# Patient Record
Sex: Male | Born: 1945 | Race: White | Hispanic: No | Marital: Married | Smoking: Never smoker
Health system: Southern US, Community
[De-identification: ages and names within clinical notes are randomized; demographics above are authoritative.]

## PROBLEM LIST (undated history)

## (undated) DIAGNOSIS — C44319 Basal cell carcinoma of skin of other parts of face: Secondary | ICD-10-CM

## (undated) DIAGNOSIS — B351 Tinea unguium: Secondary | ICD-10-CM

## (undated) DIAGNOSIS — C9110 Chronic lymphocytic leukemia of B-cell type not having achieved remission: Secondary | ICD-10-CM

## (undated) DIAGNOSIS — C9590 Leukemia, unspecified not having achieved remission: Secondary | ICD-10-CM

## (undated) DIAGNOSIS — C44221 Squamous cell carcinoma of skin of unspecified ear and external auricular canal: Secondary | ICD-10-CM

## (undated) HISTORY — DX: Tinea unguium: B35.1

## (undated) HISTORY — DX: Chronic lymphocytic leukemia of B-cell type not having achieved remission: C91.10

## (undated) HISTORY — DX: Other disorders of bilirubin metabolism: E80.6

## (undated) HISTORY — DX: Squamous cell carcinoma of skin of unspecified ear and external auricular canal: C44.221

## (undated) HISTORY — DX: Leukemia, unspecified not having achieved remission: C95.90

## (undated) HISTORY — DX: Basal cell carcinoma of skin of other parts of face: C44.319

## (undated) SURGERY — Surgical Case
Anesthesia: *Unknown

---

## 2004-12-05 HISTORY — PX: SQUAMOUS CELL CARCINOMA EXCISION: SHX2433

## 2006-07-04 ENCOUNTER — Ambulatory Visit: Payer: Self-pay | Admitting: Oncology

## 2006-07-24 ENCOUNTER — Other Ambulatory Visit: Admission: RE | Admit: 2006-07-24 | Discharge: 2006-07-24 | Payer: Self-pay | Admitting: Oncology

## 2006-07-24 ENCOUNTER — Encounter (HOSPITAL_COMMUNITY): Payer: Self-pay | Admitting: Oncology

## 2006-07-24 LAB — CBC WITH DIFFERENTIAL/PLATELET
Eosinophils Absolute: 0.1 10*3/uL (ref 0.0–0.5)
MCH: 29.2 pg (ref 28.0–33.4)
MCV: 86.9 fL (ref 81.6–98.0)
MONO#: 0.5 10*3/uL (ref 0.1–0.9)
NEUT#: 4.3 10*3/uL (ref 1.5–6.5)
RBC: 5.02 10*6/uL (ref 4.20–5.71)
WBC: 15 10*3/uL — ABNORMAL HIGH (ref 4.0–10.0)

## 2006-07-24 LAB — COMPREHENSIVE METABOLIC PANEL
ALT: 12 U/L (ref 0–40)
BUN: 21 mg/dL (ref 6–23)
CO2: 31 mEq/L (ref 19–32)
Calcium: 10.1 mg/dL (ref 8.4–10.5)
Chloride: 103 mEq/L (ref 96–112)
Creatinine, Ser: 1.09 mg/dL (ref 0.40–1.50)
Total Bilirubin: 1.8 mg/dL — ABNORMAL HIGH (ref 0.3–1.2)

## 2006-07-24 LAB — LACTATE DEHYDROGENASE: LDH: 137 U/L (ref 94–250)

## 2006-07-24 LAB — CHCC SMEAR

## 2006-07-28 LAB — FLOW CYTOMETRY

## 2006-11-17 ENCOUNTER — Ambulatory Visit: Payer: Self-pay | Admitting: Oncology

## 2006-11-21 LAB — CBC WITH DIFFERENTIAL/PLATELET
EOS%: 1.7 % (ref 0.0–7.0)
Eosinophils Absolute: 0.3 10*3/uL (ref 0.0–0.5)
HCT: 43.1 % (ref 38.7–49.9)
HGB: 14.5 g/dL (ref 13.0–17.1)
MCH: 28.9 pg (ref 28.0–33.4)
Platelets: 344 10*3/uL (ref 145–400)
RBC: 5.03 10*6/uL (ref 4.20–5.71)
WBC: 19.9 10*3/uL — ABNORMAL HIGH (ref 4.0–10.0)

## 2006-11-21 LAB — LACTATE DEHYDROGENASE: LDH: 131 U/L (ref 94–250)

## 2006-11-21 LAB — COMPREHENSIVE METABOLIC PANEL
ALT: 17 U/L (ref 0–53)
AST: 17 U/L (ref 0–37)
BUN: 20 mg/dL (ref 6–23)
Chloride: 99 mEq/L (ref 96–112)
Creatinine, Ser: 1.01 mg/dL (ref 0.40–1.50)
Glucose, Bld: 94 mg/dL (ref 70–99)
Potassium: 4.7 mEq/L (ref 3.5–5.3)
Sodium: 138 mEq/L (ref 135–145)
Total Bilirubin: 1 mg/dL (ref 0.3–1.2)
Total Protein: 6.9 g/dL (ref 6.0–8.3)

## 2006-11-24 ENCOUNTER — Ambulatory Visit (HOSPITAL_COMMUNITY): Admission: RE | Admit: 2006-11-24 | Discharge: 2006-11-24 | Payer: Self-pay | Admitting: Oncology

## 2007-01-19 ENCOUNTER — Ambulatory Visit: Payer: Self-pay | Admitting: Oncology

## 2007-01-24 LAB — CBC WITH DIFFERENTIAL/PLATELET
Basophils Absolute: 0.4 10*3/uL — ABNORMAL HIGH (ref 0.0–0.1)
Eosinophils Absolute: 0.2 10*3/uL (ref 0.0–0.5)
HCT: 44.6 % (ref 38.7–49.9)
HGB: 15.1 g/dL (ref 13.0–17.1)
MCH: 28.8 pg (ref 28.0–33.4)
MONO#: 0.5 10*3/uL (ref 0.1–0.9)
NEUT#: 4.8 10*3/uL (ref 1.5–6.5)
NEUT%: 23.8 % — ABNORMAL LOW (ref 40.0–75.0)
RDW: 11.4 % (ref 11.2–14.6)
lymph#: 14.3 10*3/uL — ABNORMAL HIGH (ref 0.9–3.3)

## 2007-01-24 LAB — COMPREHENSIVE METABOLIC PANEL
Alkaline Phosphatase: 59 U/L (ref 39–117)
Glucose, Bld: 77 mg/dL (ref 70–99)
Sodium: 142 mEq/L (ref 135–145)
Total Bilirubin: 1.2 mg/dL (ref 0.3–1.2)
Total Protein: 6.9 g/dL (ref 6.0–8.3)

## 2007-05-17 ENCOUNTER — Ambulatory Visit: Payer: Self-pay | Admitting: Oncology

## 2007-05-21 LAB — CBC WITH DIFFERENTIAL/PLATELET
Eosinophils Absolute: 0.4 10*3/uL (ref 0.0–0.5)
HCT: 42.8 % (ref 38.7–49.9)
LYMPH%: 69.2 % — ABNORMAL HIGH (ref 14.0–48.0)
MCHC: 34.1 g/dL (ref 32.0–35.9)
MONO#: 0.4 10*3/uL (ref 0.1–0.9)
NEUT#: 5.1 10*3/uL (ref 1.5–6.5)
NEUT%: 26.2 % — ABNORMAL LOW (ref 40.0–75.0)
Platelets: 286 10*3/uL (ref 145–400)
WBC: 19.4 10*3/uL — ABNORMAL HIGH (ref 4.0–10.0)

## 2007-05-21 LAB — COMPREHENSIVE METABOLIC PANEL
CO2: 28 mEq/L (ref 19–32)
Creatinine, Ser: 1.02 mg/dL (ref 0.40–1.50)
Glucose, Bld: 132 mg/dL — ABNORMAL HIGH (ref 70–99)
Total Bilirubin: 1.3 mg/dL — ABNORMAL HIGH (ref 0.3–1.2)

## 2007-05-21 LAB — LACTATE DEHYDROGENASE: LDH: 128 U/L (ref 94–250)

## 2007-10-03 ENCOUNTER — Ambulatory Visit: Payer: Self-pay | Admitting: Oncology

## 2007-10-05 LAB — CBC WITH DIFFERENTIAL/PLATELET
BASO%: 0.2 % (ref 0.0–2.0)
EOS%: 1 % (ref 0.0–7.0)
LYMPH%: 72.5 % — ABNORMAL HIGH (ref 14.0–48.0)
MCH: 29.8 pg (ref 28.0–33.4)
MCHC: 34.5 g/dL (ref 32.0–35.9)
MCV: 86.4 fL (ref 81.6–98.0)
MONO%: 1.8 % (ref 0.0–13.0)
Platelets: 297 10*3/uL (ref 145–400)
RBC: 4.96 10*6/uL (ref 4.20–5.71)
RDW: 12.9 % (ref 11.2–14.6)

## 2007-10-05 LAB — COMPREHENSIVE METABOLIC PANEL
AST: 15 U/L (ref 0–37)
Alkaline Phosphatase: 53 U/L (ref 39–117)
Glucose, Bld: 116 mg/dL — ABNORMAL HIGH (ref 70–99)
Sodium: 140 mEq/L (ref 135–145)
Total Bilirubin: 1.5 mg/dL — ABNORMAL HIGH (ref 0.3–1.2)
Total Protein: 6.9 g/dL (ref 6.0–8.3)

## 2008-03-31 ENCOUNTER — Ambulatory Visit: Payer: Self-pay | Admitting: Oncology

## 2008-04-02 LAB — CBC WITH DIFFERENTIAL/PLATELET
BASO%: 0.4 % (ref 0.0–2.0)
HGB: 14.4 g/dL (ref 13.0–17.1)
LYMPH%: 72 % — ABNORMAL HIGH (ref 14.0–48.0)
MCV: 85.9 fL (ref 81.6–98.0)
MONO#: 0.5 10*3/uL (ref 0.1–0.9)
NEUT#: 5.4 10*3/uL (ref 1.5–6.5)
NEUT%: 24.3 % — ABNORMAL LOW (ref 40.0–75.0)
WBC: 22 10*3/uL — ABNORMAL HIGH (ref 4.0–10.0)

## 2008-04-02 LAB — COMPREHENSIVE METABOLIC PANEL
Albumin: 4.1 g/dL (ref 3.5–5.2)
Alkaline Phosphatase: 50 U/L (ref 39–117)
BUN: 23 mg/dL (ref 6–23)
Chloride: 102 mEq/L (ref 96–112)
Creatinine, Ser: 0.9 mg/dL (ref 0.40–1.50)
Potassium: 4.5 mEq/L (ref 3.5–5.3)
Sodium: 138 mEq/L (ref 135–145)
Total Bilirubin: 2.4 mg/dL — ABNORMAL HIGH (ref 0.3–1.2)

## 2008-09-30 ENCOUNTER — Ambulatory Visit: Payer: Self-pay | Admitting: Oncology

## 2008-10-02 LAB — CBC WITH DIFFERENTIAL/PLATELET
Basophils Absolute: 0 10*3/uL (ref 0.0–0.1)
Eosinophils Absolute: 0.2 10*3/uL (ref 0.0–0.5)
HGB: 14.4 g/dL (ref 13.0–17.1)
LYMPH%: 77.6 % — ABNORMAL HIGH (ref 14.0–48.0)
MCHC: 33.7 g/dL (ref 32.0–35.9)
MCV: 87.1 fL (ref 81.6–98.0)
MONO#: 0.5 10*3/uL (ref 0.1–0.9)
NEUT%: 19.4 % — ABNORMAL LOW (ref 40.0–75.0)
lymph#: 17.5 10*3/uL — ABNORMAL HIGH (ref 0.9–3.3)

## 2008-10-02 LAB — COMPREHENSIVE METABOLIC PANEL
BUN: 21 mg/dL (ref 6–23)
CO2: 29 mEq/L (ref 19–32)
Calcium: 9.5 mg/dL (ref 8.4–10.5)
Creatinine, Ser: 1.06 mg/dL (ref 0.40–1.50)
Potassium: 4.5 mEq/L (ref 3.5–5.3)
Total Protein: 6.6 g/dL (ref 6.0–8.3)

## 2008-10-02 LAB — LACTATE DEHYDROGENASE: LDH: 106 U/L (ref 94–250)

## 2009-03-31 ENCOUNTER — Ambulatory Visit: Payer: Self-pay | Admitting: Oncology

## 2009-04-02 LAB — COMPREHENSIVE METABOLIC PANEL
ALT: 12 U/L (ref 0–53)
AST: 15 U/L (ref 0–37)
Albumin: 4.4 g/dL (ref 3.5–5.2)
Alkaline Phosphatase: 54 U/L (ref 39–117)
BUN: 22 mg/dL (ref 6–23)
CO2: 21 mEq/L (ref 19–32)
Calcium: 9.4 mg/dL (ref 8.4–10.5)
Chloride: 103 mEq/L (ref 96–112)
Creatinine, Ser: 1.04 mg/dL (ref 0.40–1.50)
Glucose, Bld: 137 mg/dL — ABNORMAL HIGH (ref 70–99)
Potassium: 4.1 mEq/L (ref 3.5–5.3)
Sodium: 139 mEq/L (ref 135–145)
Total Bilirubin: 1.8 mg/dL — ABNORMAL HIGH (ref 0.3–1.2)
Total Protein: 7 g/dL (ref 6.0–8.3)

## 2009-04-02 LAB — CBC WITH DIFFERENTIAL/PLATELET
Eosinophils Absolute: 0.2 10*3/uL (ref 0.0–0.5)
HCT: 43.5 % (ref 38.4–49.9)
LYMPH%: 79.7 % — ABNORMAL HIGH (ref 14.0–49.0)
MONO%: 1.6 % (ref 0.0–14.0)
NEUT#: 5 10*3/uL (ref 1.5–6.5)
NEUT%: 17.8 % — ABNORMAL LOW (ref 39.0–75.0)
WBC: 28.1 10*3/uL — ABNORMAL HIGH (ref 4.0–10.3)

## 2009-09-28 ENCOUNTER — Ambulatory Visit: Payer: Self-pay | Admitting: Oncology

## 2009-10-01 LAB — CBC WITH DIFFERENTIAL/PLATELET
BASO%: 0.4 % (ref 0.0–2.0)
Basophils Absolute: 0.1 10*3/uL (ref 0.0–0.1)
HGB: 13.1 g/dL (ref 13.0–17.1)
LYMPH%: 86 % — ABNORMAL HIGH (ref 14.0–49.0)
MCV: 88.2 fL (ref 79.3–98.0)
MONO#: 0.9 10*3/uL (ref 0.1–0.9)
MONO%: 6.3 % (ref 0.0–14.0)
NEUT%: 4.1 % — ABNORMAL LOW (ref 39.0–75.0)
Platelets: 329 10*3/uL (ref 140–400)
WBC: 13.8 10*3/uL — ABNORMAL HIGH (ref 4.0–10.3)
lymph#: 11.9 10*3/uL — ABNORMAL HIGH (ref 0.9–3.3)

## 2009-10-01 LAB — COMPREHENSIVE METABOLIC PANEL
ALT: 33 U/L (ref 0–53)
CO2: 28 mEq/L (ref 19–32)
Chloride: 102 mEq/L (ref 96–112)
Creatinine, Ser: 0.91 mg/dL (ref 0.40–1.50)
Potassium: 4.5 mEq/L (ref 3.5–5.3)
Sodium: 138 mEq/L (ref 135–145)
Total Bilirubin: 0.9 mg/dL (ref 0.3–1.2)
Total Protein: 6.5 g/dL (ref 6.0–8.3)

## 2009-10-01 LAB — LACTATE DEHYDROGENASE: LDH: 115 U/L (ref 94–250)

## 2009-10-08 LAB — CBC WITH DIFFERENTIAL/PLATELET
Basophils Absolute: 0 10*3/uL (ref 0.0–0.1)
EOS%: 2 % (ref 0.0–7.0)
Eosinophils Absolute: 0.3 10*3/uL (ref 0.0–0.5)
HCT: 43.8 % (ref 38.4–49.9)
HGB: 14.6 g/dL (ref 13.0–17.1)
LYMPH%: 87.3 % — ABNORMAL HIGH (ref 14.0–49.0)
MCH: 29.1 pg (ref 27.2–33.4)
MCHC: 33.3 g/dL (ref 32.0–36.0)
NEUT#: 0.7 10*3/uL — ABNORMAL LOW (ref 1.5–6.5)
Platelets: 218 10*3/uL (ref 140–400)
RBC: 5.02 10*6/uL (ref 4.20–5.82)
WBC: 12.7 10*3/uL — ABNORMAL HIGH (ref 4.0–10.3)

## 2009-10-27 ENCOUNTER — Ambulatory Visit: Payer: Self-pay | Admitting: Oncology

## 2009-11-02 LAB — CBC WITH DIFFERENTIAL/PLATELET
Basophils Absolute: 0.1 10*3/uL (ref 0.0–0.1)
EOS%: 1.7 % (ref 0.0–7.0)
Eosinophils Absolute: 0.3 10*3/uL (ref 0.0–0.5)
HGB: 13.9 g/dL (ref 13.0–17.1)
MCH: 29.7 pg (ref 27.2–33.4)
MCV: 88.7 fL (ref 79.3–98.0)
NEUT#: 4.7 10*3/uL (ref 1.5–6.5)
RDW: 12.9 % (ref 11.0–14.6)

## 2010-03-31 ENCOUNTER — Ambulatory Visit: Payer: Self-pay | Admitting: Oncology

## 2010-04-01 LAB — COMPREHENSIVE METABOLIC PANEL
ALT: 16 U/L (ref 0–53)
AST: 17 U/L (ref 0–37)
Albumin: 4.1 g/dL (ref 3.5–5.2)
Alkaline Phosphatase: 58 U/L (ref 39–117)
CO2: 27 mEq/L (ref 19–32)
Calcium: 9.4 mg/dL (ref 8.4–10.5)
Chloride: 101 mEq/L (ref 96–112)
Creatinine, Ser: 1.01 mg/dL (ref 0.40–1.50)
Total Bilirubin: 2 mg/dL — ABNORMAL HIGH (ref 0.3–1.2)
Total Protein: 6.6 g/dL (ref 6.0–8.3)

## 2010-04-01 LAB — CBC WITH DIFFERENTIAL/PLATELET
Basophils Absolute: 0.1 10*3/uL (ref 0.0–0.1)
HCT: 41.7 % (ref 38.4–49.9)
HGB: 13.8 g/dL (ref 13.0–17.1)
LYMPH%: 76.7 % — ABNORMAL HIGH (ref 14.0–49.0)
MCH: 29.6 pg (ref 27.2–33.4)
MCV: 89.2 fL (ref 79.3–98.0)
MONO%: 1.8 % (ref 0.0–14.0)
NEUT#: 5.2 10*3/uL (ref 1.5–6.5)
NEUT%: 20.7 % — ABNORMAL LOW (ref 39.0–75.0)
RBC: 4.68 10*6/uL (ref 4.20–5.82)

## 2010-09-17 ENCOUNTER — Ambulatory Visit: Payer: Self-pay | Admitting: Oncology

## 2010-09-21 LAB — CBC WITH DIFFERENTIAL/PLATELET
BASO%: 0.2 % (ref 0.0–2.0)
Basophils Absolute: 0.1 10*3/uL (ref 0.0–0.1)
EOS%: 0.5 % (ref 0.0–7.0)
Eosinophils Absolute: 0.2 10*3/uL (ref 0.0–0.5)
HCT: 42.6 % (ref 38.4–49.9)
LYMPH%: 81.1 % — ABNORMAL HIGH (ref 14.0–49.0)
MONO#: 0.7 10*3/uL (ref 0.1–0.9)
MONO%: 2.5 % (ref 0.0–14.0)
NEUT#: 4.6 10*3/uL (ref 1.5–6.5)
NEUT%: 15.7 % — ABNORMAL LOW (ref 39.0–75.0)
RBC: 4.84 10*6/uL (ref 4.20–5.82)

## 2010-09-21 LAB — COMPREHENSIVE METABOLIC PANEL
AST: 20 U/L (ref 0–37)
Albumin: 4.1 g/dL (ref 3.5–5.2)
Alkaline Phosphatase: 61 U/L (ref 39–117)
Calcium: 9.4 mg/dL (ref 8.4–10.5)
Chloride: 101 mEq/L (ref 96–112)

## 2010-09-21 LAB — TECHNOLOGIST REVIEW

## 2010-12-05 HISTORY — PX: OTHER SURGICAL HISTORY: SHX169

## 2011-03-24 ENCOUNTER — Other Ambulatory Visit (HOSPITAL_COMMUNITY): Payer: Self-pay | Admitting: Oncology

## 2011-03-24 ENCOUNTER — Encounter (HOSPITAL_BASED_OUTPATIENT_CLINIC_OR_DEPARTMENT_OTHER): Payer: 59 | Admitting: Oncology

## 2011-03-24 DIAGNOSIS — C9110 Chronic lymphocytic leukemia of B-cell type not having achieved remission: Secondary | ICD-10-CM

## 2011-03-24 LAB — CBC WITH DIFFERENTIAL/PLATELET
Basophils Absolute: 0.1 10*3/uL (ref 0.0–0.1)
HGB: 13.9 g/dL (ref 13.0–17.1)
LYMPH%: 79.1 % — ABNORMAL HIGH (ref 14.0–49.0)
MCHC: 32.9 g/dL (ref 32.0–36.0)
MCV: 87.5 fL (ref 79.3–98.0)
MONO%: 1.1 % (ref 0.0–14.0)
NEUT%: 18.7 % — ABNORMAL LOW (ref 39.0–75.0)
WBC: 26.6 10*3/uL — ABNORMAL HIGH (ref 4.0–10.3)

## 2011-03-24 LAB — COMPREHENSIVE METABOLIC PANEL
ALT: 13 U/L (ref 0–53)
Albumin: 4.2 g/dL (ref 3.5–5.2)
BUN: 15 mg/dL (ref 6–23)
Chloride: 103 mEq/L (ref 96–112)
Glucose, Bld: 101 mg/dL — ABNORMAL HIGH (ref 70–99)
Potassium: 4.5 mEq/L (ref 3.5–5.3)
Total Protein: 6.6 g/dL (ref 6.0–8.3)

## 2011-09-22 ENCOUNTER — Encounter (HOSPITAL_BASED_OUTPATIENT_CLINIC_OR_DEPARTMENT_OTHER): Payer: Commercial Managed Care - PPO | Admitting: Oncology

## 2011-09-22 ENCOUNTER — Other Ambulatory Visit (HOSPITAL_COMMUNITY): Payer: Self-pay | Admitting: Oncology

## 2011-09-22 DIAGNOSIS — C9110 Chronic lymphocytic leukemia of B-cell type not having achieved remission: Secondary | ICD-10-CM

## 2011-09-22 DIAGNOSIS — R21 Rash and other nonspecific skin eruption: Secondary | ICD-10-CM

## 2011-09-22 LAB — CBC WITH DIFFERENTIAL/PLATELET
BASO%: 0.2 % (ref 0.0–2.0)
Eosinophils Absolute: 0.1 10*3/uL (ref 0.0–0.5)
HCT: 43.5 % (ref 38.4–49.9)
LYMPH%: 82.2 % — ABNORMAL HIGH (ref 14.0–49.0)
MCHC: 33.1 g/dL (ref 32.0–36.0)
MCV: 88.7 fL (ref 79.3–98.0)
MONO#: 0.5 10*3/uL (ref 0.1–0.9)
NEUT%: 15.5 % — ABNORMAL LOW (ref 39.0–75.0)
Platelets: 250 10*3/uL (ref 140–400)
WBC: 28.6 10*3/uL — ABNORMAL HIGH (ref 4.0–10.3)

## 2011-09-22 LAB — COMPREHENSIVE METABOLIC PANEL
Albumin: 4.4 g/dL (ref 3.5–5.2)
CO2: 28 mEq/L (ref 19–32)
Calcium: 9.7 mg/dL (ref 8.4–10.5)
Creatinine, Ser: 1 mg/dL (ref 0.50–1.35)
Glucose, Bld: 71 mg/dL (ref 70–99)
Total Bilirubin: 2 mg/dL — ABNORMAL HIGH (ref 0.3–1.2)

## 2012-01-10 ENCOUNTER — Telehealth: Payer: Self-pay | Admitting: Oncology

## 2012-01-10 NOTE — Telephone Encounter (Signed)
Lvm advising lab/md appt on 03/22/12 @ 9.30am. I have also mailed pt an appt calendar.

## 2012-02-17 ENCOUNTER — Other Ambulatory Visit: Payer: Self-pay | Admitting: Dermatology

## 2012-03-16 ENCOUNTER — Telehealth: Payer: Self-pay | Admitting: Oncology

## 2012-03-16 NOTE — Telephone Encounter (Signed)
Moved 4/18 appt from DM to SW on 4/17 per order for pt [phone]. S/w pt today re change w/new d/t. No availability w/SW 4/18.

## 2012-03-21 ENCOUNTER — Other Ambulatory Visit (HOSPITAL_BASED_OUTPATIENT_CLINIC_OR_DEPARTMENT_OTHER): Payer: Commercial Managed Care - PPO | Admitting: Lab

## 2012-03-21 ENCOUNTER — Telehealth: Payer: Self-pay | Admitting: Oncology

## 2012-03-21 ENCOUNTER — Ambulatory Visit (HOSPITAL_BASED_OUTPATIENT_CLINIC_OR_DEPARTMENT_OTHER): Payer: Commercial Managed Care - PPO | Admitting: Physician Assistant

## 2012-03-21 ENCOUNTER — Encounter: Payer: Self-pay | Admitting: Physician Assistant

## 2012-03-21 VITALS — BP 115/64 | HR 65 | Temp 98.5°F | Ht 68.0 in | Wt 162.5 lb

## 2012-03-21 DIAGNOSIS — D72828 Other elevated white blood cell count: Secondary | ICD-10-CM

## 2012-03-21 DIAGNOSIS — C9110 Chronic lymphocytic leukemia of B-cell type not having achieved remission: Secondary | ICD-10-CM

## 2012-03-21 LAB — CBC WITH DIFFERENTIAL/PLATELET
Basophils Absolute: 0.1 10*3/uL (ref 0.0–0.1)
EOS%: 0.4 % (ref 0.0–7.0)
Eosinophils Absolute: 0.1 10*3/uL (ref 0.0–0.5)
HCT: 41.7 % (ref 38.4–49.9)
HGB: 13.5 g/dL (ref 13.0–17.1)
MONO#: 0.4 10*3/uL (ref 0.1–0.9)
NEUT#: 4.8 10*3/uL (ref 1.5–6.5)
RDW: 13.8 % (ref 11.0–14.6)
WBC: 30 10*3/uL — ABNORMAL HIGH (ref 4.0–10.3)
lymph#: 24.6 10*3/uL — ABNORMAL HIGH (ref 0.9–3.3)

## 2012-03-21 LAB — COMPREHENSIVE METABOLIC PANEL
ALT: 13 U/L (ref 0–53)
Albumin: 4.1 g/dL (ref 3.5–5.2)
CO2: 30 mEq/L (ref 19–32)
Chloride: 106 mEq/L (ref 96–112)
Potassium: 4.6 mEq/L (ref 3.5–5.3)
Sodium: 142 mEq/L (ref 135–145)
Total Bilirubin: 1.7 mg/dL — ABNORMAL HIGH (ref 0.3–1.2)
Total Protein: 6.4 g/dL (ref 6.0–8.3)

## 2012-03-21 LAB — TECHNOLOGIST REVIEW

## 2012-03-21 LAB — LACTATE DEHYDROGENASE: LDH: 119 U/L (ref 94–250)

## 2012-03-21 NOTE — Progress Notes (Signed)
[facility] OFFICE PROGRESS NOTE  No primary provider on file.  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  Since his last clinic visit in October 2012, he reports occasional mild fatigue but no difficulty completing ADLs.  He continues to work.  He has had no fevers, chills, or night sweats.  No dyspnea or cough.  He reports normal appetite and has had no issues with nausea, vomiting, constipation, or diarrhea.  No dysuria, frequency, or hematuria.  No alteration in sensation or balance or swelling of extremities.  He has have resection of a lesion on the right earlobe consistent with squamous cell carcinoma. He has have removed another lesion on the left temporal area consistent with basal call carcinoma. These procedures were performed by Dr.Goodrich at [facility]. He is currently not on any type of medications.   His last CBC with diff in October of  revealed white blood count of 28.6, hemoglobin 14.4, hematocrit 43.5, platelets of 250, ANC of 4.4 and MCV of 88.7.  LDH was 143 with albumin of 4.2. Today, his CBC shows H/H  13.5/41.7, WBC of 30.0, ANC of 4.8, Platelets 264. His chemistries and LDH are currently pending.   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  MEDICATIONS: No current outpatient prescriptions on file.    ALLERGIES:   has no allergies on file.  REVIEW OF SYSTEMS:  The rest of the 14-point review of system was negative.   Filed Vitals:   03/21/12 1038  BP: 115/64  Pulse: 65  Temp: 98.5 F (36.9 C)   Wt Readings from Last 3 Encounters:  03/21/12 162 lb 8 oz (73.71 kg)     PHYSICAL  EXAMINATION:.  General:  This is a well-developed, well-nourished white male in no acute distress.  HEENT:  Sclerae nonicteric.  There is no oral thrush or mucositis.  Skin:  right auricle well healed area as well as in the Right temple.   Lymph:  No cervical, supraclavicular, axillary, or inguinal lymphadenopathy.  He does have some occipital adenopathy noted on the left side; this is nontender and approximately 1 x 1 cm.  Cardiac:  Regular rate and rhythm without murmurs or gallops.  Peripheral pulses are 2+.  Chest:  Lungs clear to auscultation.  Abdomen:  Positive bowel sounds, soft, nontender, nondistended.  There is no organomegaly.  Extremities:  No edema, cyanosis or calf tenderness.  Neuro:  Alert and oriented x3.  Strength, sensation, and coordination all grossly intact.     LABORATORY/RADIOLOGY DATA:   Lab 03/21/12 1007  WBC 30.0*  HGB 13.5  HCT  41.7  PLT 264  MCV 89.1  MCH 28.8  MCHC 32.3  RDW 13.8  LYMPHSABS 24.6*  MONOABS 0.4  EOSABS 0.1  BASOSABS 0.1  BANDABS --    CMP   No results found for this basename: NA:5,K:5,CL:5,CO2:5,GLUCOSE:5,BUN:5,CREATININE:5,GFRCGP,:5,CALCIUM:5,MG:5,AST:5,ALT:5,ALKPHOS:5,BILITOT:5 in the last 168 hours      Component Value Date/Time   BILITOT 2.0* 09/22/2011 1050     Radiology Studies:  No results found.     ASSESSMENT AND PLAN:  1.  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  He is currently on q.6 month surveillance and has had no major changes in his energy level since the last clinic visit and no fevers, chills or night sweats.  The patient is advised again that he really needs to find a primary care physician for his routine health maintenance, and he states that he has had a primary care physician in the past but really has not had any health issues that would warrant following up  with a primary.  He states he will consider doing this at this time, however. He continues to decline Pneumovax injections. 2. The patient will be scheduled for followup with Dr. Arline Asp in 6 months' time, at which time we will reassess CBC with diff, CMET and LDH.  He is instructed to call in the interim if any questions or problems.

## 2012-03-21 NOTE — Telephone Encounter (Signed)
Gave pt appt for October 2013 lab and md

## 2012-03-22 ENCOUNTER — Ambulatory Visit: Payer: Commercial Managed Care - PPO | Admitting: Oncology

## 2012-03-22 ENCOUNTER — Other Ambulatory Visit: Payer: Commercial Managed Care - PPO

## 2012-08-21 ENCOUNTER — Telehealth: Payer: Self-pay | Admitting: Oncology

## 2012-08-22 ENCOUNTER — Telehealth: Payer: Self-pay | Admitting: Oncology

## 2012-08-22 NOTE — Telephone Encounter (Signed)
pt  called and r/s his 09/2012 appt to 10/2012   aom

## 2012-09-20 ENCOUNTER — Other Ambulatory Visit: Payer: Commercial Managed Care - PPO | Admitting: Lab

## 2012-09-20 ENCOUNTER — Ambulatory Visit: Payer: Commercial Managed Care - PPO | Admitting: Oncology

## 2012-10-05 ENCOUNTER — Telehealth: Payer: Self-pay | Admitting: Oncology

## 2012-10-05 NOTE — Telephone Encounter (Signed)
Pt called back and r/s 11/4 appt to 12/2. Dec per pt.

## 2012-10-05 NOTE — Telephone Encounter (Addendum)
R/s 11/11 appt to 11/4 poof. lmonvm for pt re new d/t for 11/4.

## 2012-10-08 ENCOUNTER — Ambulatory Visit: Payer: Commercial Managed Care - PPO | Admitting: Family

## 2012-10-08 ENCOUNTER — Other Ambulatory Visit: Payer: Commercial Managed Care - PPO | Admitting: Lab

## 2012-10-15 ENCOUNTER — Other Ambulatory Visit: Payer: Commercial Managed Care - PPO | Admitting: Lab

## 2012-10-15 ENCOUNTER — Ambulatory Visit: Payer: Commercial Managed Care - PPO | Admitting: Oncology

## 2012-11-06 ENCOUNTER — Encounter: Payer: Self-pay | Admitting: Family

## 2012-11-06 ENCOUNTER — Other Ambulatory Visit (HOSPITAL_BASED_OUTPATIENT_CLINIC_OR_DEPARTMENT_OTHER): Payer: Commercial Managed Care - PPO

## 2012-11-06 ENCOUNTER — Telehealth: Payer: Self-pay | Admitting: Oncology

## 2012-11-06 ENCOUNTER — Ambulatory Visit (HOSPITAL_BASED_OUTPATIENT_CLINIC_OR_DEPARTMENT_OTHER): Payer: Commercial Managed Care - PPO | Admitting: Family

## 2012-11-06 VITALS — BP 133/60 | HR 57 | Temp 97.1°F | Resp 20 | Ht 68.0 in | Wt 149.6 lb

## 2012-11-06 DIAGNOSIS — L989 Disorder of the skin and subcutaneous tissue, unspecified: Secondary | ICD-10-CM

## 2012-11-06 DIAGNOSIS — C9110 Chronic lymphocytic leukemia of B-cell type not having achieved remission: Secondary | ICD-10-CM

## 2012-11-06 LAB — CBC WITH DIFFERENTIAL/PLATELET
Eosinophils Absolute: 0.2 10*3/uL (ref 0.0–0.5)
MONO#: 0.7 10*3/uL (ref 0.1–0.9)
NEUT#: 4.1 10*3/uL (ref 1.5–6.5)
Platelets: 251 10*3/uL (ref 140–400)
RBC: 4.9 10*6/uL (ref 4.20–5.82)
RDW: 13.3 % (ref 11.0–14.6)
WBC: 27.3 10*3/uL — ABNORMAL HIGH (ref 4.0–10.3)
nRBC: 0 % (ref 0–0)

## 2012-11-06 LAB — COMPREHENSIVE METABOLIC PANEL (CC13)
ALT: 19 U/L (ref 0–55)
CO2: 31 mEq/L — ABNORMAL HIGH (ref 22–29)
Calcium: 9.6 mg/dL (ref 8.4–10.4)
Chloride: 105 mEq/L (ref 98–107)
Sodium: 143 mEq/L (ref 136–145)
Total Protein: 6.8 g/dL (ref 6.4–8.3)

## 2012-11-06 LAB — LACTATE DEHYDROGENASE (CC13): LDH: 134 U/L (ref 125–245)

## 2012-11-06 NOTE — Patient Instructions (Signed)
Please consider obtaining a primary care physician to discuss ongoing fatigue, check cholesterol....etc. Please speak with Dr. Irene Limbo about the skin changes on your hand.

## 2012-11-06 NOTE — Telephone Encounter (Signed)
gv and printed appt schedule for pt for June and NOV 2014

## 2012-11-06 NOTE — Progress Notes (Signed)
Patient ID: Douglas [name], male   DOB: [birth year], 66 y.o.   MRN: [id number] CSN: [id number]  Knute Neu. Irene Limbo, MD   Problem List: Douglas [name] is a 66 y.o. Caucasian male with a problem list consisting of:  1.  Chronic lymphocytic leukemia, stage 0, diagnosed 08/23/2005, flow cytometry was diagnostic and carried out in August 2007. 2.  Onychomycosis 3.  Hyperbilirubinemia 4.  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  Basal cell carcinoma temporal areas s/p excision (03/2011)  Dr. 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 Since his last clinic visit on 03/21/2012, he reports skin changes on his left hand and ongoing mild fatigue but no difficulty completing ADLs. He continues to work 5 days per week at [employer].  He has not had any hospitalizations, fevers, chills, night sweats, dyspnea or cough.  He reports normal appetite and has intentionally lost weight to run in a 5K race.  Douglas [name] has not had any issues with nausea, vomiting, constipation, diarrhea, dysuria, frequency, or hematuria.    Past Medical History: Past Medical History  Diagnosis Date  . Leukemia   . Hyperbilirubinemia   . Onychomycosis   . 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 Basal cell carcinoma of right temple region   . Leukemia, chronic lymphocytic     Surgical History: Past Surgical History  Procedure Date  . Squamous cell carcinoma excision 2006  . Basal cell carcinoma excison 2012    Current Medications: No current outpatient prescriptions on file.    Allergies: Allergies  Allergen Reactions  . Sulfa Antibiotics Rash    Family History: Family History  Problem Relation Age of Onset  . Alzheimer's disease Mother   . Hypertension Father   . Diabetes Father   . Heart attack Father   . Heart attack Brother   . Cancer Brother     Social  History: History  Substance Use Topics  . Smoking status: Never Smoker   . Smokeless tobacco: Never Used  . Alcohol Use: No    Review of Systems: 10 Point review of systems was completed and is negative except as noted above.   Physical Exam:   Blood pressure 133/60, pulse 57, temperature 97.1 F (36.2 C), temperature source Oral, resp. rate 20, height 5\' 8"  (1.727 m), weight 149 lb 9.6 oz (67.858 kg).  General appearance: Alert, cooperative, well nourished, no apparent distress Head: Normocephalic, occipital area nodule that measures 2cm x 2cm, atraumatic Eyes: Conjunctivae/corneas clear, PERRLA, EOMI Nose: Nares, septum and mucosa are normal, no drainage or sinus tenderness Neck: No adenopathy, supple, symmetrical, trachea midline, thyroid not enlarged, no tenderness Resp: Clear to auscultation bilaterally Cardio: Regular rate and rhythm, S1, S2 normal, no murmur, click, rub or gallop GI: Soft, non-tender, distended, hypoactive bowel sounds, no organomegaly Extremities: Extremities normal, atraumatic, no cyanosis or edema, finger onychomycosis on left hand and what appears to be pre-cancerous lesions on the back of the left hand Lymph nodes: Cervical, supraclavicular, and axillary nodes normal Neurologic: Grossly normal   Laboratory Data: Results for orders placed in visit on 11/06/12 (from the past 48 hour(s))  CBC WITH DIFFERENTIAL     Status: Abnormal   Collection Time   11/06/12  9:41 AM      Component Value Range Comment   WBC 27.3 (*) 4.0 - 10.3 10e3/uL    NEUT# 4.1  1.5 - 6.5 10e3/uL    HGB 14.1  13.0 - 17.1 g/dL    HCT 96.0  45.4 - 09.8 %    Platelets 251  140 - 400 10e3/uL    MCV 88.2  79.3 - 98.0 fL    MCH 28.8  27.2 - 33.4 pg    MCHC 32.6  32.0 - 36.0 g/dL    RBC 1.19  1.47 - 8.29 10e6/uL    RDW 13.3  11.0 - 14.6 %    lymph# 22.3 (*) 0.9 - 3.3 10e3/uL    MONO# 0.7  0.1 - 0.9 10e3/uL    Eosinophils Absolute 0.2  0.0 - 0.5 10e3/uL    Basophils Absolute 0.1   0.0 - 0.1 10e3/uL    NEUT% 14.9 (*) 39.0 - 75.0 %    LYMPH% 81.6 (*) 14.0 - 49.0 %    MONO% 2.6  0.0 - 14.0 %    EOS% 0.7  0.0 - 7.0 %    BASO% 0.2  0.0 - 2.0 %    nRBC 0  0 - 0 %   COMPREHENSIVE METABOLIC PANEL (CC13)     Status: Abnormal   Collection Time   11/06/12  9:41 AM      Component Value Range Comment   Sodium 143  136 - 145 mEq/L    Potassium 4.6  3.5 - 5.1 mEq/L    Chloride 105  98 - 107 mEq/L    CO2 31 (*) 22 - 29 mEq/L    Glucose 105 (*) 70 - 99 mg/dl    BUN 56.2  7.0 - 13.0 mg/dL    Creatinine 1.0  0.7 - 1.3 mg/dL    Total Bilirubin 8.65 (*) 0.20 - 1.20 mg/dL    Alkaline Phosphatase 75  40 - 150 U/L    AST 20  5 - 34 U/L    ALT 19  0 - 55 U/L    Total Protein 6.8  6.4 - 8.3 g/dL    Albumin 3.9  3.5 - 5.0 g/dL    Calcium 9.6  8.4 - 78.4 mg/dL   LACTATE DEHYDROGENASE (CC13)     Status: Normal   Collection Time   11/06/12  9:41 AM      Component Value Range Comment   LDH 134  125 - 245 U/L 10/11/12 - NOTE new reference range.  TECHNOLOGIST REVIEW     Status: Normal   Collection Time   11/06/12  9:41 AM      Component Value Range Comment   Technologist Review smudge cells and Variant lymphs present        Imaging Studies: 1.  Chest x-ray 2 views on 11/24/2006 showed borderline cardiomegaly and mild changes of COPD and chronic bronchitis. No acute abnormality.   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 The patient is advised again that he really needs to find a primary care physician for his routine health maintenance and screening exams.  Douglas [name] states that he has had a primary care physician in the past but really has not had any health issues that would warrant following up with a primary.  Douglas [name] has been strongly encouraged to follow up with his Dermatologist, Dr. Irene Limbo about the skin changes  on the back of his left hand.   He continues to decline Pneumovax and influenza injections.  We will check  a CBC on 05/07/2013.  Douglas [name] is scheduled for followup with Dr. Arline Asp on 10/08/2013, at which time we will reassess CBC, CMP and LDH. The patient is asked to contact us in the interim if any questions or concerns.   Larina Bras, NP-C 11/06/2012, 10:07 AM

## 2013-01-19 ENCOUNTER — Other Ambulatory Visit: Payer: Self-pay

## 2013-03-11 ENCOUNTER — Other Ambulatory Visit: Payer: Self-pay | Admitting: Dermatology

## 2013-03-12 ENCOUNTER — Telehealth: Payer: Self-pay | Admitting: *Deleted

## 2013-03-12 NOTE — Telephone Encounter (Signed)
Pt called wanting a later d/t in the month. gv pt 10/22/13 lab @ 8:30am and ov @ 9am. Pt is aware.

## 2013-05-07 ENCOUNTER — Other Ambulatory Visit (HOSPITAL_BASED_OUTPATIENT_CLINIC_OR_DEPARTMENT_OTHER): Payer: Commercial Managed Care - PPO | Admitting: Lab

## 2013-05-07 DIAGNOSIS — C9110 Chronic lymphocytic leukemia of B-cell type not having achieved remission: Secondary | ICD-10-CM

## 2013-05-07 LAB — CBC WITH DIFFERENTIAL/PLATELET
BASO%: 0.2 % (ref 0.0–2.0)
EOS%: 0.7 % (ref 0.0–7.0)
HCT: 39.9 % (ref 38.4–49.9)
MCH: 29 pg (ref 27.2–33.4)
MCHC: 32.8 g/dL (ref 32.0–36.0)
NEUT%: 15.7 % — ABNORMAL LOW (ref 39.0–75.0)
lymph#: 23.2 10*3/uL — ABNORMAL HIGH (ref 0.9–3.3)

## 2013-07-10 ENCOUNTER — Other Ambulatory Visit: Payer: Self-pay

## 2013-10-08 ENCOUNTER — Other Ambulatory Visit: Payer: Commercial Managed Care - PPO | Admitting: Lab

## 2013-10-08 ENCOUNTER — Ambulatory Visit: Payer: Commercial Managed Care - PPO | Admitting: Oncology

## 2013-10-10 ENCOUNTER — Other Ambulatory Visit: Payer: Self-pay

## 2013-10-22 ENCOUNTER — Telehealth: Payer: Self-pay | Admitting: Internal Medicine

## 2013-10-22 ENCOUNTER — Ambulatory Visit (HOSPITAL_BASED_OUTPATIENT_CLINIC_OR_DEPARTMENT_OTHER): Payer: Commercial Managed Care - PPO | Admitting: Internal Medicine

## 2013-10-22 ENCOUNTER — Other Ambulatory Visit (HOSPITAL_BASED_OUTPATIENT_CLINIC_OR_DEPARTMENT_OTHER): Payer: Commercial Managed Care - PPO

## 2013-10-22 ENCOUNTER — Other Ambulatory Visit: Payer: Self-pay | Admitting: Internal Medicine

## 2013-10-22 VITALS — BP 126/70 | HR 74 | Temp 97.0°F | Resp 20 | Ht 68.0 in | Wt 157.6 lb

## 2013-10-22 DIAGNOSIS — C9110 Chronic lymphocytic leukemia of B-cell type not having achieved remission: Secondary | ICD-10-CM

## 2013-10-22 DIAGNOSIS — R05 Cough: Secondary | ICD-10-CM

## 2013-10-22 LAB — TECHNOLOGIST REVIEW

## 2013-10-22 LAB — COMPREHENSIVE METABOLIC PANEL (CC13)
ALT: 11 U/L (ref 0–55)
AST: 16 U/L (ref 5–34)
Alkaline Phosphatase: 79 U/L (ref 40–150)
Sodium: 140 mEq/L (ref 136–145)
Total Bilirubin: 1.12 mg/dL (ref 0.20–1.20)
Total Protein: 7.1 g/dL (ref 6.4–8.3)

## 2013-10-22 LAB — CBC WITH DIFFERENTIAL/PLATELET
BASO%: 0.3 % (ref 0.0–2.0)
EOS%: 1.1 % (ref 0.0–7.0)
LYMPH%: 82.5 % — ABNORMAL HIGH (ref 14.0–49.0)
MCHC: 32.1 g/dL (ref 32.0–36.0)
MCV: 88.2 fL (ref 79.3–98.0)
MONO#: 0.5 10*3/uL (ref 0.1–0.9)
MONO%: 2 % (ref 0.0–14.0)
Platelets: 309 10*3/uL (ref 140–400)
RBC: 4.82 10*6/uL (ref 4.20–5.82)
WBC: 24.6 10*3/uL — ABNORMAL HIGH (ref 4.0–10.3)

## 2013-10-22 NOTE — Patient Instructions (Signed)
Chronic Lymphocytic Leukemia Chronic lymphocytic leukemia (CLL) is a type of cancer of the bone marrow and blood cells. Bone marrow is the soft, spongy tissue inside your bone. In CLL, the bone marrow makes too many white blood cells that usually fight infection in the body (lymphocytes). CLL usually gets worse slowly and is the most common type of adult leukemia.  RISK FACTORS No one knows the exact cause of CLL. There is a higher risk of CLL in people who:   Are older than 50 years.  Are white.  Are male.  Have a family history of CLL or other cancers of the lymph system.  Are of Russian Jewish or Eastern European Jewish descent.  Have been exposed to certain chemicals, such as Agent Orange (used in the Vietnam War) or other herbicides or insecticides. SYMPTOMS  At first, there may be no symptoms of chronic lymphocytic leukemia. After a while, some symptoms may occur, such as:   Feeling more tired than usual, even after rest.  Unplanned weight loss.  Heavy sweating at night.  Fevers.  Shortness of breath.  Decreased energy.  Paleness.  Painless, swollen lymph nodes.  A feeling of fullness in the upper left part of the abdomen.  Easy bruising or bleeding.  More frequent infections. DIAGNOSIS  Your health care provider may perform the following exams and tests to diagnose CLL:  Physical exam to check for an enlarged spleen, liver, or lymph nodes.  Blood and bone marrow tests to identify the presence of cancer cells. These may include tests such as complete blood count, flow cytometry, immunophenotyping, and fluorescence in situ hybridization (FISH).  CT scan to look for swelling or abnormalities in your spleen, liver, and lymph nodes. TREATMENT  Treatment options for CLL depend on the stage and the presence of symptoms. There are a number of types of treatment used for this condition, including:  Observation.  Targeted drugs. These are drugs that interfere with  chemicals that leukemia cells need in order to grow and multiply. They identify and attack specific cancer cells without harming normal cells.  Chemotherapy drugs. These medicines kill cells that are multiplying quickly, such as leukemia cells.  Radiation.  Surgery to remove the spleen.  Biological therapy. This treatment boosts the ability of your own immune system to fight the leukemia cells.  Bone marrow or peripheral blood stem cell transplant. This treatment allows the patient to receive very high doses of chemotherapy and/or radiation. These high doses kill the cancer cells but also destroy the bone marrow. After treatment is complete, you are given donor bone marrow or stem cells, which will replace the bone marrow. HOME CARE INSTRUCTIONS   Because you have an increased risk of infection, practice good hand washing and avoid being around people who are ill or being in crowded places.  Because you have an increased risk of bleeding and bruising, avoid contact sports or other rough activities.  Only take over-the-counter or prescription medicines for pain, discomfort, or fever as directed by your health care provider.  Although some of your treatments might affect your appetite, try to eat regular, healthy meals.  If you develop any side effects, such as nausea, diarrhea, rash, white patches in your mouth, a sore throat, difficulty swallowing, or severe fatigue, tell your health care provider. He or she may have recommendations of things you can do to improve symptoms.  Consider learning some ways to cope with the stress of having a chronic illness, such as yoga, meditation,   or participating in a support group. SEEK MEDICAL CARE IF:  You develop chest pains.  You notice pain, swelling or redness anywhere in your legs.  You have pain in your belly (abdomen).  You develop new bruises that are getting bigger.  You have painful or more swollen lymph nodes.  You develop bleeding  from your gums, nose, or in your urine or stools.  You are unable to stop throwing up (vomiting).  You cannot keep liquids down.  You feel lightheaded.  You have a fever or persistent symptoms for more than 2 3 days.  You develop a severe stiff neck or headache. SEEK IMMEDIATE MEDICAL CARE IF:  You have trouble breathing or feel short of breath.  You faint. Document Released: 04/09/2009 Document Revised: 07/24/2013 Document Reviewed: 05/16/2013 Exit[employer] Patient Information 2014 [employer].  

## 2013-10-22 NOTE — Telephone Encounter (Signed)
gv and printed appt sched for May and NOV 2015

## 2013-10-22 NOTE — Progress Notes (Signed)
[facility] OFFICE PROGRESS NOTE  No PCP Per Patient [address] [place] [address]  DIAGNOSIS: CLL (chronic lymphocytic leukemia) - Plan: CBC with Differential, Comprehensive metabolic panel, CBC with Differential  Chief Complaint  Patient presents with  . CLL (chronic lymphocytic leukemia)    CURRENT THERAPY: Observation.   INTERVAL HISTORY: Douglas [name] 67 y.o. male with a history of CLL is here for follow-up.  He was last seen by NP Adela Lank A. Hunter on 11/06/2012.  His CLL is stage 0, diagnosed 08/23/2005, flow cytometry was diagnostic and carried out in August 2007.   Since his last visit, he reports doing well overall.  He had a recent trip to [place] and has since had a dry cough.  He denies any sick contacts or additional associated symptoms.   He denies any recent emergency room visits or hospitalizations, fevers, chills, night sweats, dyspnea.   He reports a good appetite with a three pound weight gain.  He declines the flu shot annually.    MEDICAL HISTORY: Past Medical History  Diagnosis Date  . Leukemia   . Hyperbilirubinemia   . Onychomycosis   . 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 Basal cell carcinoma of right temple region   . Leukemia, chronic lymphocytic     INTERIM HISTORY: has CLL (chronic lymphocytic leukemia) on his problem list.    ALLERGIES:  is allergic to sulfa antibiotics.  MEDICATIONS: currently has no medications in their medication list.  SURGICAL HISTORY:  Past Surgical History  Procedure Laterality Date  . Squamous cell carcinoma excision  2006  . Basal cell carcinoma excison  2012    REVIEW OF SYSTEMS:   Constitutional: Denies fevers, chills or abnormal weight loss Eyes: Denies blurriness of vision Ears, nose, mouth, throat, and face: Denies mucositis or sore throat Respiratory: Denies cough, dyspnea or wheezes Cardiovascular: Denies palpitation, chest discomfort or lower extremity  swelling Gastrointestinal:  Denies nausea, heartburn or change in bowel habits Skin: Denies abnormal skin rashes Lymphatics: Denies new lymphadenopathy or easy bruising Neurological:Denies numbness, tingling or new weaknesses Behavioral/Psych: Mood is stable, no new changes  All other systems were reviewed with the patient and are negative.  PHYSICAL EXAMINATION: ECOG PERFORMANCE STATUS: 0 - Asymptomatic  Blood pressure 126/70, pulse 74, temperature 97 F (36.1 C), temperature source Oral, resp. rate 20, height 5\' 8"  (1.727 m), weight 157 lb 9.6 oz (71.487 kg).  GENERAL:alert, no distress and comfortable; well-developed, well nourished.  SKIN: skin color, texture, turgor are normal, no rashes or significant lesions; L posterior neck cyst about the size of a nickel in diameter, raised and non-tender.  The patient reports this is chronic.  EYES: normal, Conjunctiva are pink and non-injected, sclera clear OROPHARYNX:no exudate, no erythema and lips, buccal mucosa, and tongue normal  NECK: supple, thyroid normal size, non-tender, without nodularity LYMPH:  no palpable lymphadenopathy in the cervical, axillary or supraclavicular LUNGS: clear to auscultation and percussion with normal breathing effort HEART: regular rate & rhythm and no murmurs and no lower extremity edema ABDOMEN:abdomen soft, non-tender and normal bowel sounds Musculoskeletal:no cyanosis of digits and no clubbing ; + onychomycosis on multiple digits of his hands.  NEURO: alert & oriented x 3 with fluent speech, no focal motor/sensory deficits   LABORATORY DATA: Results for orders placed in visit on 10/22/13 (from the past 48 hour(s))  CBC WITH DIFFERENTIAL     Status: Abnormal   Collection Time    10/22/13  8:36 AM      Result Value Range   WBC 24.6 (*) 4.0 - 10.3 10e3/uL   NEUT# 3.5  1.5 - 6.5 10e3/uL   HGB 13.7  13.0 - 17.1 g/dL   HCT 45.4  09.8 - 11.9 %   Platelets 309  140 - 400 10e3/uL   MCV 88.2  79.3 - 98.0  fL   MCH 28.3  27.2 - 33.4 pg   MCHC 32.1  32.0 - 36.0 g/dL   RBC 1.47  8.29 - 5.62 10e6/uL   RDW 13.2  11.0 - 14.6 %   lymph# 20.3 (*) 0.9 - 3.3 10e3/uL   MONO# 0.5  0.1 - 0.9 10e3/uL   Eosinophils Absolute 0.3  0.0 - 0.5 10e3/uL   Basophils Absolute 0.1  0.0 - 0.1 10e3/uL   NEUT% 14.1 (*) 39.0 - 75.0 %   LYMPH% 82.5 (*) 14.0 - 49.0 %   MONO% 2.0  0.0 - 14.0 %   EOS% 1.1  0.0 - 7.0 %   BASO% 0.3  0.0 - 2.0 %  TECHNOLOGIST REVIEW     Status: None   Collection Time    10/22/13  8:36 AM      Result Value Range   Technologist Review Variant lymphs present,smudge cells         Labs:  Lab Results  Component Value Date   WBC 24.6* 10/22/2013   HGB 13.7 10/22/2013   HCT 42.5 10/22/2013   MCV 88.2 10/22/2013   PLT 309 10/22/2013   NEUTROABS 3.5 10/22/2013      Chemistry      Component Value Date/Time   NA 143 11/06/2012 0941   NA 142 03/21/2012 1007   K 4.6 11/06/2012 0941   K 4.6 03/21/2012 1007   CL 105 11/06/2012 0941   CL 106 03/21/2012 1007   CO2 31* 11/06/2012 0941   CO2 30 03/21/2012 1007   BUN 18.0 11/06/2012 0941   BUN 17 03/21/2012 1007   CREATININE 1.0 11/06/2012 0941   CREATININE 1.00 03/21/2012 1007      Component Value Date/Time   CALCIUM 9.6 11/06/2012 0941   CALCIUM 9.3 03/21/2012 1007   ALKPHOS 75 11/06/2012 0941   ALKPHOS 55 03/21/2012 1007   AST 20 11/06/2012 0941   AST 17 03/21/2012 1007   ALT 19 11/06/2012 0941   ALT 13 03/21/2012 1007   BILITOT 1.90* 11/06/2012 0941   BILITOT 1.7* 03/21/2012 1007      CBC:  Recent Labs Lab 10/22/13 0836  WBC 24.6*  NEUTROABS 3.5  HGB 13.7  HCT 42.5  MCV 88.2  PLT 309   Microbiology Recent Results (from the past 240 hour(s))  TECHNOLOGIST REVIEW     Status: None   Collection Time    10/22/13  8:36 AM      Result Value Range Status   Technologist Review Variant lymphs present,smudge cells   Final   Studies:  No results found.   RADIOGRAPHIC STUDIES: No results found.  ASSESSMENT: Douglas [name] 67  y.o. male with a history of CLL (chronic lymphocytic leukemia) - Plan: CBC with Differential, Comprehensive metabolic panel, CBC with Differential   PLAN:  1. CLL -- Patient continues to do clinically.  He denies any constitutional symptoms.  His CBC demonstrates a stable WBC count, slightly less than 6 months ago.   2. Cough. --We counseled the patient to report to healthcare provider should it persists for greater than one week, become productive or he develop  fevers.     3. Follow-up. --patient will follow up in one year with cbc and chemistries.  CBC in 6 months.   He was provided a handout on CLL.  He was again advised to find a primary care physician for his routine health maintenance and screening exams.  He continues to decline pneumovax and influenza injections.   All questions were answered. The patient knows to call the clinic with any problems, questions or concerns. We can certainly see the patient much sooner if necessary.  I spent 10 minutes counseling the patient face to face. The total time spent in the appointment was 15 minutes.    Mireille Lacombe, MD 10/22/2013 9:35 AM

## 2014-04-21 ENCOUNTER — Telehealth: Payer: Self-pay | Admitting: Internal Medicine

## 2014-04-21 ENCOUNTER — Other Ambulatory Visit (HOSPITAL_BASED_OUTPATIENT_CLINIC_OR_DEPARTMENT_OTHER): Payer: Commercial Managed Care - PPO

## 2014-04-21 ENCOUNTER — Ambulatory Visit (HOSPITAL_BASED_OUTPATIENT_CLINIC_OR_DEPARTMENT_OTHER): Payer: Commercial Managed Care - PPO | Admitting: Internal Medicine

## 2014-04-21 VITALS — BP 118/62 | HR 70 | Temp 98.2°F | Resp 20 | Ht 68.0 in | Wt 162.5 lb

## 2014-04-21 DIAGNOSIS — C9110 Chronic lymphocytic leukemia of B-cell type not having achieved remission: Secondary | ICD-10-CM

## 2014-04-21 DIAGNOSIS — Z87898 Personal history of other specified conditions: Secondary | ICD-10-CM

## 2014-04-21 LAB — CBC WITH DIFFERENTIAL/PLATELET
BASO%: 0.2 % (ref 0.0–2.0)
Basophils Absolute: 0.1 10*3/uL (ref 0.0–0.1)
EOS ABS: 0.2 10*3/uL (ref 0.0–0.5)
EOS%: 0.5 % (ref 0.0–7.0)
HCT: 42.7 % (ref 38.4–49.9)
HGB: 14.1 g/dL (ref 13.0–17.1)
LYMPH%: 79.8 % — AB (ref 14.0–49.0)
MCH: 28.8 pg (ref 27.2–33.4)
MCHC: 33 g/dL (ref 32.0–36.0)
MCV: 87.1 fL (ref 79.3–98.0)
MONO#: 0.6 10*3/uL (ref 0.1–0.9)
MONO%: 2.1 % (ref 0.0–14.0)
NEUT%: 17.4 % — ABNORMAL LOW (ref 39.0–75.0)
NEUTROS ABS: 5.1 10*3/uL (ref 1.5–6.5)
NRBC: 0 % (ref 0–0)
Platelets: 255 10*3/uL (ref 140–400)
RBC: 4.9 10*6/uL (ref 4.20–5.82)
RDW: 13.6 % (ref 11.0–14.6)
WBC: 29.3 10*3/uL — AB (ref 4.0–10.3)
lymph#: 23.4 10*3/uL — ABNORMAL HIGH (ref 0.9–3.3)

## 2014-04-21 LAB — TECHNOLOGIST REVIEW

## 2014-04-21 LAB — COMPREHENSIVE METABOLIC PANEL (CC13)
ALBUMIN: 3.7 g/dL (ref 3.5–5.0)
ALT: 14 U/L (ref 0–55)
ANION GAP: 12 meq/L — AB (ref 3–11)
AST: 16 U/L (ref 5–34)
Alkaline Phosphatase: 63 U/L (ref 40–150)
BUN: 18.3 mg/dL (ref 7.0–26.0)
CALCIUM: 9.1 mg/dL (ref 8.4–10.4)
CHLORIDE: 106 meq/L (ref 98–109)
CO2: 24 meq/L (ref 22–29)
Creatinine: 1 mg/dL (ref 0.7–1.3)
GLUCOSE: 103 mg/dL (ref 70–140)
POTASSIUM: 4.1 meq/L (ref 3.5–5.1)
SODIUM: 142 meq/L (ref 136–145)
TOTAL PROTEIN: 6.5 g/dL (ref 6.4–8.3)
Total Bilirubin: 1.66 mg/dL — ABNORMAL HIGH (ref 0.20–1.20)

## 2014-04-21 NOTE — Patient Instructions (Signed)
Chronic Lymphocytic Leukemia Chronic lymphocytic leukemia (CLL) is a type of cancer of the bone marrow and blood cells. Bone marrow is the soft, spongy tissue inside your bone. In CLL, the bone marrow makes too many white blood cells that usually fight infection in the body (lymphocytes). CLL usually gets worse slowly and is the most common type of adult leukemia.  RISK FACTORS No one knows the exact cause of CLL. There is a higher risk of CLL in people who:   Are older than 50 years.  Are white.  Are male.  Have a family history of CLL or other cancers of the lymph system.  Are of Russian Jewish or Eastern European Jewish descent.  Have been exposed to certain chemicals, such as Agent Orange (used in the Vietnam War) or other herbicides or insecticides. SYMPTOMS  At first, there may be no symptoms of chronic lymphocytic leukemia. After a while, some symptoms may occur, such as:   Feeling more tired than usual, even after rest.  Unplanned weight loss.  Heavy sweating at night.  Fevers.  Shortness of breath.  Decreased energy.  Paleness.  Painless, swollen lymph nodes.  A feeling of fullness in the upper left part of the abdomen.  Easy bruising or bleeding.  More frequent infections. DIAGNOSIS  Your health care provider may perform the following exams and tests to diagnose CLL:  Physical exam to check for an enlarged spleen, liver, or lymph nodes.  Blood and bone marrow tests to identify the presence of cancer cells. These may include tests such as complete blood count, flow cytometry, immunophenotyping, and fluorescence in situ hybridization (FISH).  CT scan to look for swelling or abnormalities in your spleen, liver, and lymph nodes. TREATMENT  Treatment options for CLL depend on the stage and the presence of symptoms. There are a number of types of treatment used for this condition, including:  Observation.  Targeted drugs. These are drugs that interfere with  chemicals that leukemia cells need in order to grow and multiply. They identify and attack specific cancer cells without harming normal cells.  Chemotherapy drugs. These medicines kill cells that are multiplying quickly, such as leukemia cells.  Radiation.  Surgery to remove the spleen.  Biological therapy. This treatment boosts the ability of your own immune system to fight the leukemia cells.  Bone marrow or peripheral blood stem cell transplant. This treatment allows the patient to receive very high doses of chemotherapy and/or radiation. These high doses kill the cancer cells but also destroy the bone marrow. After treatment is complete, you are given donor bone marrow or stem cells, which will replace the bone marrow. HOME CARE INSTRUCTIONS   Because you have an increased risk of infection, practice good hand washing and avoid being around people who are ill or being in crowded places.  Because you have an increased risk of bleeding and bruising, avoid contact sports or other rough activities.  Only take over-the-counter or prescription medicines for pain, discomfort, or fever as directed by your health care provider.  Although some of your treatments might affect your appetite, try to eat regular, healthy meals.  If you develop any side effects, such as nausea, diarrhea, rash, white patches in your mouth, a sore throat, difficulty swallowing, or severe fatigue, tell your health care provider. He or she may have recommendations of things you can do to improve symptoms.  Consider learning some ways to cope with the stress of having a chronic illness, such as yoga, meditation,   or participating in a support group. SEEK MEDICAL CARE IF:  You develop chest pains.  You notice pain, swelling or redness anywhere in your legs.  You have pain in your belly (abdomen).  You develop new bruises that are getting bigger.  You have painful or more swollen lymph nodes.  You develop bleeding  from your gums, nose, or in your urine or stools.  You are unable to stop throwing up (vomiting).  You cannot keep liquids down.  You feel lightheaded.  You have a fever or persistent symptoms for more than 2 3 days.  You develop a severe stiff neck or headache. SEEK IMMEDIATE MEDICAL CARE IF:  You have trouble breathing or feel short of breath.  You faint. Document Released: 04/09/2009 Document Revised: 07/24/2013 Document Reviewed: 05/16/2013 Exit[employer] Patient Information 2014 [employer].  

## 2014-04-21 NOTE — Telephone Encounter (Signed)
gv adn printed appt sched and avs for opt fro NOV adn May 2016

## 2014-04-21 NOTE — Progress Notes (Signed)
[place] OFFICE PROGRESS NOTE  No PCP Per Patient No address on file  DIAGNOSIS: CLL (chronic lymphocytic leukemia)  Chief Complaint  Patient presents with  . Follow-up    CURRENT THERAPY: Observation.   INTERVAL HISTORY: Douglas [name] 68 y.o. male with a history of CLL is here for follow-up.  He was last seen by me on 10/22/2013.  His CLL is stage 0, diagnosed 08/23/2005, flow cytometry was diagnostic and carried out in August 2007.   Since his last visit, he reports doing well overall.  He had a recent trip to [place] and has since had a dry cough.  He denies any sick contacts or additional associated symptoms.   He denies any recent emergency room visits or hospitalizations, fevers, chills, night sweats, dyspnea.   He reports a good appetite with a three pound weight gain.  He declines the flu shot annually.  He declines colonoscopy. He was never a smoker.    MEDICAL HISTORY: Past Medical History  Diagnosis Date  . Leukemia   . Hyperbilirubinemia   . Onychomycosis   . 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 Basal cell carcinoma of right temple region   . Leukemia, chronic lymphocytic     INTERIM HISTORY: has CLL (chronic lymphocytic leukemia) on his problem list.    ALLERGIES:  is allergic to sulfa antibiotics.  MEDICATIONS: currently has no medications in their medication list.  SURGICAL HISTORY:  Past Surgical History  Procedure Laterality Date  . Squamous cell carcinoma excision  2006  . Basal cell carcinoma excison  2012    REVIEW OF SYSTEMS:   Constitutional: Denies fevers, chills or abnormal weight loss Eyes: Denies blurriness of vision Ears, nose, mouth, throat, and face: Denies mucositis or sore throat Respiratory: Denies cough, dyspnea or wheezes Cardiovascular: Denies palpitation, chest discomfort or lower extremity swelling Gastrointestinal:  Denies nausea, heartburn or change in bowel habits Skin: Denies abnormal skin  rashes Lymphatics: Denies new lymphadenopathy or easy bruising Neurological:Denies numbness, tingling or new weaknesses Behavioral/Psych: Mood is stable, no new changes  All other systems were reviewed with the patient and are negative.  PHYSICAL EXAMINATION: ECOG PERFORMANCE STATUS: 0 - Asymptomatic  Blood pressure 118/62, pulse 70, temperature 98.2 F (36.8 C), temperature source Oral, resp. rate 20, height 5\' 8"  (1.727 m), weight 162 lb 8 oz (73.71 kg).  GENERAL:alert, no distress and comfortable; well-developed, well nourished.  SKIN: skin color, texture, turgor are normal, no rashes or significant lesions; L posterior neck cyst about the size of a nickel in diameter, raised and non-tender.  The patient reports this is chronic.  EYES: normal, Conjunctiva are pink and non-injected, sclera clear OROPHARYNX:no exudate, no erythema and lips, buccal mucosa, and tongue normal  NECK: supple, thyroid normal size, non-tender, without nodularity LYMPH:  no palpable lymphadenopathy in the cervical, axillary or supraclavicular LUNGS: clear to auscultation and percussion with normal breathing effort HEART: regular rate & rhythm and no murmurs and no lower extremity edema ABDOMEN:abdomen soft, non-tender and normal bowel sounds Musculoskeletal:no cyanosis of digits and no clubbing ; + onychomycosis on multiple digits of his hands.  NEURO: alert & oriented x 3 with fluent speech, no focal motor/sensory deficits   LABORATORY DATA: Results for orders placed in visit on 04/21/14 (from the past 48 hour(s))  COMPREHENSIVE METABOLIC PANEL (BJ47)     Status: Abnormal   Collection Time    04/21/14  8:10 AM      Result Value Ref  Range   Sodium 142  136 - 145 mEq/L   Potassium 4.1  3.5 - 5.1 mEq/L   Chloride 106  98 - 109 mEq/L   CO2 24  22 - 29 mEq/L   Glucose 103  70 - 140 mg/dl   BUN 18.3  7.0 - 26.0 mg/dL   Creatinine 1.0  0.7 - 1.3 mg/dL   Total Bilirubin 1.66 (*) 0.20 - 1.20 mg/dL   Alkaline  Phosphatase 63  40 - 150 U/L   AST 16  5 - 34 U/L   ALT 14  0 - 55 U/L   Total Protein 6.5  6.4 - 8.3 g/dL   Albumin 3.7  3.5 - 5.0 g/dL   Calcium 9.1  8.4 - 10.4 mg/dL   Anion Gap 12 (*) 3 - 11 mEq/L  CBC WITH DIFFERENTIAL     Status: Abnormal   Collection Time    04/21/14  8:11 AM      Result Value Ref Range   WBC 29.3 (*) 4.0 - 10.3 10e3/uL   NEUT# 5.1  1.5 - 6.5 10e3/uL   HGB 14.1  13.0 - 17.1 g/dL   HCT 42.7  38.4 - 49.9 %   Platelets 255  140 - 400 10e3/uL   MCV 87.1  79.3 - 98.0 fL   MCH 28.8  27.2 - 33.4 pg   MCHC 33.0  32.0 - 36.0 g/dL   RBC 4.90  4.20 - 5.82 10e6/uL   RDW 13.6  11.0 - 14.6 %   lymph# 23.4 (*) 0.9 - 3.3 10e3/uL   MONO# 0.6  0.1 - 0.9 10e3/uL   Eosinophils Absolute 0.2  0.0 - 0.5 10e3/uL   Basophils Absolute 0.1  0.0 - 0.1 10e3/uL   NEUT% 17.4 (*) 39.0 - 75.0 %   LYMPH% 79.8 (*) 14.0 - 49.0 %   MONO% 2.1  0.0 - 14.0 %   EOS% 0.5  0.0 - 7.0 %   BASO% 0.2  0.0 - 2.0 %   nRBC 0  0 - 0 %    Labs:  Lab Results  Component Value Date   WBC 29.3* 04/21/2014   HGB 14.1 04/21/2014   HCT 42.7 04/21/2014   MCV 87.1 04/21/2014   PLT 255 04/21/2014   NEUTROABS 5.1 04/21/2014      Chemistry      Component Value Date/Time   NA 142 04/21/2014 0810   NA 142 03/21/2012 1007   K 4.1 04/21/2014 0810   K 4.6 03/21/2012 1007   CL 105 11/06/2012 0941   CL 106 03/21/2012 1007   CO2 24 04/21/2014 0810   CO2 30 03/21/2012 1007   BUN 18.3 04/21/2014 0810   BUN 17 03/21/2012 1007   CREATININE 1.0 04/21/2014 0810   CREATININE 1.00 03/21/2012 1007      Component Value Date/Time   CALCIUM 9.1 04/21/2014 0810   CALCIUM 9.3 03/21/2012 1007   ALKPHOS 63 04/21/2014 0810   ALKPHOS 55 03/21/2012 1007   AST 16 04/21/2014 0810   AST 17 03/21/2012 1007   ALT 14 04/21/2014 0810   ALT 13 03/21/2012 1007   BILITOT 1.66* 04/21/2014 0810   BILITOT 1.7* 03/21/2012 1007      CBC:  Recent Labs Lab 04/21/14 0811  WBC 29.3*  NEUTROABS 5.1  HGB 14.1  HCT 42.7  MCV 87.1  PLT 255    Microbiology No results found for this or any previous visit (from the past 240 hour(s)). Studies:  No results  found.   RADIOGRAPHIC STUDIES: No results found.  ASSESSMENT: Douglas [name] 68 y.o. male with a history of CLL (chronic lymphocytic leukemia)   PLAN:  1. CLL -- Patient continues to do clinically.  He denies any constitutional symptoms.  His CBC demonstrates a stable WBC count, slightly less than 6 months ago.   2. Follow-up. --patient will follow up in one year with cbc and chemistries.  CBC in 6 months.   He was provided a handout on CLL.  He was again advised to find a primary care physician for his routine health maintenance and screening exams.  He continues to decline pneumovax and influenza injections.   All questions were answered. The patient knows to call the clinic with any problems, questions or concerns. We can certainly see the patient much sooner if necessary.  I spent 10 minutes counseling the patient face to face. The total time spent in the appointment was 15 minutes.    Concha Norway, MD 04/21/2014 8:53 AM

## 2014-10-17 ENCOUNTER — Other Ambulatory Visit: Payer: Self-pay

## 2014-10-17 DIAGNOSIS — C9110 Chronic lymphocytic leukemia of B-cell type not having achieved remission: Secondary | ICD-10-CM

## 2014-10-20 ENCOUNTER — Other Ambulatory Visit (HOSPITAL_BASED_OUTPATIENT_CLINIC_OR_DEPARTMENT_OTHER): Payer: Commercial Managed Care - PPO

## 2014-10-20 DIAGNOSIS — C9110 Chronic lymphocytic leukemia of B-cell type not having achieved remission: Secondary | ICD-10-CM

## 2014-10-20 DIAGNOSIS — Z856 Personal history of leukemia: Secondary | ICD-10-CM

## 2014-10-20 LAB — CBC WITH DIFFERENTIAL/PLATELET
BASO%: 0.3 % (ref 0.0–2.0)
BASOS ABS: 0.1 10*3/uL (ref 0.0–0.1)
EOS%: 0.6 % (ref 0.0–7.0)
Eosinophils Absolute: 0.2 10*3/uL (ref 0.0–0.5)
HEMATOCRIT: 47.7 % (ref 38.4–49.9)
HGB: 15 g/dL (ref 13.0–17.1)
LYMPH#: 23.9 10*3/uL — AB (ref 0.9–3.3)
LYMPH%: 80 % — ABNORMAL HIGH (ref 14.0–49.0)
MCH: 27.8 pg (ref 27.2–33.4)
MCHC: 31.4 g/dL — ABNORMAL LOW (ref 32.0–36.0)
MCV: 88.6 fL (ref 79.3–98.0)
MONO#: 0.5 10*3/uL (ref 0.1–0.9)
MONO%: 1.8 % (ref 0.0–14.0)
NEUT%: 17.3 % — AB (ref 39.0–75.0)
NEUTROS ABS: 5.2 10*3/uL (ref 1.5–6.5)
Platelets: 285 10*3/uL (ref 140–400)
RBC: 5.39 10*6/uL (ref 4.20–5.82)
RDW: 13.8 % (ref 11.0–14.6)
WBC: 29.8 10*3/uL — ABNORMAL HIGH (ref 4.0–10.3)

## 2014-10-20 LAB — TECHNOLOGIST REVIEW

## 2015-03-18 ENCOUNTER — Telehealth: Payer: Self-pay | Admitting: Hematology and Oncology

## 2015-03-18 NOTE — Telephone Encounter (Signed)
Called and left a message with a new appoinment with dr Alvy Bimler

## 2015-03-19 ENCOUNTER — Other Ambulatory Visit: Payer: Self-pay | Admitting: Dermatology

## 2015-04-20 ENCOUNTER — Ambulatory Visit: Payer: Commercial Managed Care - PPO

## 2015-04-20 ENCOUNTER — Other Ambulatory Visit: Payer: Commercial Managed Care - PPO

## 2015-05-12 ENCOUNTER — Telehealth: Payer: Self-pay | Admitting: Hematology and Oncology

## 2015-05-12 ENCOUNTER — Other Ambulatory Visit (HOSPITAL_BASED_OUTPATIENT_CLINIC_OR_DEPARTMENT_OTHER): Payer: Commercial Managed Care - PPO

## 2015-05-12 ENCOUNTER — Ambulatory Visit (HOSPITAL_BASED_OUTPATIENT_CLINIC_OR_DEPARTMENT_OTHER): Payer: Commercial Managed Care - PPO | Admitting: Hematology and Oncology

## 2015-05-12 ENCOUNTER — Other Ambulatory Visit: Payer: Self-pay | Admitting: Hematology and Oncology

## 2015-05-12 ENCOUNTER — Encounter: Payer: Self-pay | Admitting: Hematology and Oncology

## 2015-05-12 VITALS — BP 121/71 | HR 66 | Temp 97.5°F | Resp 19 | Ht 68.0 in | Wt 163.4 lb

## 2015-05-12 DIAGNOSIS — C9110 Chronic lymphocytic leukemia of B-cell type not having achieved remission: Secondary | ICD-10-CM

## 2015-05-12 DIAGNOSIS — Z85828 Personal history of other malignant neoplasm of skin: Secondary | ICD-10-CM | POA: Diagnosis not present

## 2015-05-12 DIAGNOSIS — R17 Unspecified jaundice: Secondary | ICD-10-CM

## 2015-05-12 LAB — CBC WITH DIFFERENTIAL/PLATELET
BASO%: 0.2 % (ref 0.0–2.0)
BASOS ABS: 0.1 10*3/uL (ref 0.0–0.1)
EOS%: 0.7 % (ref 0.0–7.0)
Eosinophils Absolute: 0.2 10*3/uL (ref 0.0–0.5)
HCT: 43.6 % (ref 38.4–49.9)
HEMOGLOBIN: 14.4 g/dL (ref 13.0–17.1)
LYMPH%: 78.3 % — AB (ref 14.0–49.0)
MCH: 29.1 pg (ref 27.2–33.4)
MCHC: 33 g/dL (ref 32.0–36.0)
MCV: 88.1 fL (ref 79.3–98.0)
MONO#: 0.6 10*3/uL (ref 0.1–0.9)
MONO%: 2 % (ref 0.0–14.0)
NEUT%: 18.8 % — ABNORMAL LOW (ref 39.0–75.0)
NEUTROS ABS: 5.3 10*3/uL (ref 1.5–6.5)
PLATELETS: 246 10*3/uL (ref 140–400)
RBC: 4.95 10*6/uL (ref 4.20–5.82)
RDW: 13.5 % (ref 11.0–14.6)
WBC: 28.2 10*3/uL — ABNORMAL HIGH (ref 4.0–10.3)
lymph#: 22.1 10*3/uL — ABNORMAL HIGH (ref 0.9–3.3)

## 2015-05-12 LAB — COMPREHENSIVE METABOLIC PANEL (CC13)
ALT: 12 U/L (ref 0–55)
ANION GAP: 6 meq/L (ref 3–11)
AST: 18 U/L (ref 5–34)
Albumin: 3.7 g/dL (ref 3.5–5.0)
Alkaline Phosphatase: 65 U/L (ref 40–150)
BUN: 17.1 mg/dL (ref 7.0–26.0)
CO2: 30 mEq/L — ABNORMAL HIGH (ref 22–29)
Calcium: 9.5 mg/dL (ref 8.4–10.4)
Chloride: 105 mEq/L (ref 98–109)
Creatinine: 1 mg/dL (ref 0.7–1.3)
EGFR: 79 mL/min/{1.73_m2} — AB (ref >90)
GLUCOSE: 113 mg/dL (ref 70–140)
Potassium: 5.1 mEq/L (ref 3.5–5.1)
SODIUM: 141 meq/L (ref 136–145)
Total Bilirubin: 2.12 mg/dL — ABNORMAL HIGH (ref 0.20–1.20)
Total Protein: 6.5 g/dL (ref 6.4–8.3)

## 2015-05-12 LAB — LACTATE DEHYDROGENASE (CC13): LDH: 171 U/L (ref 125–245)

## 2015-05-12 LAB — TECHNOLOGIST REVIEW

## 2015-05-12 NOTE — Telephone Encounter (Signed)
Gave adn printed appt sched and avs for pt for June 2017 °

## 2015-05-13 DIAGNOSIS — R17 Unspecified jaundice: Secondary | ICD-10-CM | POA: Insufficient documentation

## 2015-05-13 DIAGNOSIS — Z85828 Personal history of other malignant neoplasm of skin: Secondary | ICD-10-CM | POA: Insufficient documentation

## 2015-05-13 NOTE — Assessment & Plan Note (Signed)
He has no signs and symptoms of disease progression I reinforced the importance of preventive vaccination and age appropriate screening programs I will see him on an annual basis with history, physical examination and blood work

## 2015-05-13 NOTE — Assessment & Plan Note (Signed)
He is at risk of skin cancer due to CLL I recommend skin protection and regular dermatology follow-up   

## 2015-05-13 NOTE — Progress Notes (Signed)
[facility] progress notes  Patient Care Team: No Pcp Per Patient as PCP - General (General Practice)  CHIEF COMPLAINTS/PURPOSE OF VISIT:  CLL, history of skin ca  HISTORY OF PRESENTING ILLNESS:  Douglas [name] 69 y.o. male was transferred to my care after his prior physician has left.  I reviewed the patient's records extensive and collaborated the history with the patient. 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 He has no symptoms and was observed. He denies new lymphadenopathy. He follows closely with dermatology. He never have surveillance colonoscopy or prostate exams nor vaccinations as he does not "believe" in them  MEDICAL HISTORY:  Past Medical History  Diagnosis Date  . Leukemia   . Hyperbilirubinemia   . Onychomycosis   . 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 Basal cell carcinoma of right temple region   . Leukemia, chronic lymphocytic     SURGICAL HISTORY: Past Surgical History  Procedure Laterality Date  . Squamous cell carcinoma excision  2006  . Basal cell carcinoma excison  2012    SOCIAL HISTORY: History   Social History  . Marital Status: Married    Spouse Name: N/A  . Number of Children: N/A  . Years of Education: N/A   Occupational History  . Not on file.   Social History Main Topics  . Smoking status: Never Smoker   . Smokeless tobacco: Never Used  . Alcohol Use: No  . Drug Use: No  . Sexual Activity: Not on file   Other Topics Concern  . Not on file   Social History Narrative    FAMILY HISTORY: Family History  Problem Relation Age of Onset  . Alzheimer's disease Mother   . Hypertension Father   . Diabetes Father   . Heart attack Father   . Heart attack Brother   . Cancer Brother     CLL    ALLERGIES:  is allergic to sulfa antibiotics.  MEDICATIONS:  No current outpatient prescriptions on file.   No current facility-administered  medications for this visit.    REVIEW OF SYSTEMS:   Constitutional: Denies fevers, chills or abnormal night sweats Eyes: Denies blurriness of vision, double vision or watery eyes Ears, nose, mouth, throat, and face: Denies mucositis or sore throat Respiratory: Denies cough, dyspnea or wheezes Cardiovascular: Denies palpitation, chest discomfort or lower extremity swelling Gastrointestinal:  Denies nausea, heartburn or change in bowel habits Skin: Denies abnormal skin rashes Lymphatics: Denies new lymphadenopathy or easy bruising Neurological:Denies numbness, tingling or new weaknesses Behavioral/Psych: Mood is stable, no new changes  All other systems were reviewed with the patient and are negative.  PHYSICAL EXAMINATION: ECOG PERFORMANCE STATUS: 0 - Asymptomatic  Filed Vitals:   05/12/15 0925  BP: 121/71  Pulse: 66  Temp: 97.5 F (36.4 C)  Resp: 19   Filed Weights   05/12/15 0925  Weight: 163 lb 6.4 oz (74.118 kg)    GENERAL:alert, no distress and comfortable SKIN: skin color, texture, turgor are normal, no rashes or significant lesions EYES: normal, conjunctiva are pink and non-injected, sclera clear OROPHARYNX:no exudate, normal lips, buccal mucosa, and tongue  NECK: supple, thyroid normal size, non-tender, without nodularity LYMPH:  no palpable lymphadenopathy in the cervical, axillary or inguinal LUNGS: clear to auscultation and percussion with normal breathing effort HEART: regular rate & rhythm and no murmurs without lower extremity edema ABDOMEN:abdomen soft, non-tender and normal bowel sounds  Musculoskeletal:no cyanosis of digits and no clubbing  PSYCH: alert & oriented x 3 with fluent speech NEURO: no focal motor/sensory deficits  LABORATORY DATA:  I have reviewed the data as listed Lab Results  Component Value Date   WBC 28.2* 05/12/2015   HGB 14.4 05/12/2015   HCT 43.6 05/12/2015   MCV 88.1 05/12/2015   PLT 246 05/12/2015    Recent Labs   05/12/15 0917  NA 141  K 5.1  CO2 30*  GLUCOSE 113  BUN 17.1  CREATININE 1.0  CALCIUM 9.5  PROT 6.5  ALBUMIN 3.7  AST 18  ALT 12  ALKPHOS 65  BILITOT 2.12*    ASSESSMENT & PLAN:  CLL (chronic lymphocytic leukemia) He has no signs and symptoms of disease progression I reinforced the importance of preventive vaccination and age appropriate screening programs I will see him on an annual basis with history, physical examination and blood work  History of skin cancer He is at risk of skin cancer due to CLL I recommend skin protection and regular dermatology follow-up  High bilirubin He is not symptomatic and has no signs of anemia. 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 Observe only   Orders Placed This Encounter  Procedures  . CBC with Differential/Platelet    Standing Status: Future     Number of Occurrences:      Standing Expiration Date: 06/15/2016    All questions were answered. The patient knows to call the clinic with any problems, questions or concerns. I spent 25 minutes counseling the patient face to face. The total time spent in the appointment was 30 minutes and more than 50% was on counseling.     [facility], [place], MD 05/13/2015 9:56 PM

## 2015-05-13 NOTE — Assessment & Plan Note (Signed)
He is not symptomatic and has no signs of anemia. 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 Observe only

## 2015-06-01 ENCOUNTER — Other Ambulatory Visit: Payer: Self-pay

## 2016-03-02 ENCOUNTER — Telehealth: Payer: Self-pay | Admitting: Hematology and Oncology

## 2016-03-02 NOTE — Telephone Encounter (Signed)
Due to 6/6 lab meeting adjusted lab/NG from 8 am to 8:30 am. Spoke with patient he is aware.

## 2016-05-10 ENCOUNTER — Telehealth: Payer: Self-pay | Admitting: Hematology and Oncology

## 2016-05-10 ENCOUNTER — Other Ambulatory Visit (HOSPITAL_BASED_OUTPATIENT_CLINIC_OR_DEPARTMENT_OTHER): Payer: Commercial Managed Care - PPO

## 2016-05-10 ENCOUNTER — Encounter: Payer: Self-pay | Admitting: Hematology and Oncology

## 2016-05-10 ENCOUNTER — Ambulatory Visit (HOSPITAL_BASED_OUTPATIENT_CLINIC_OR_DEPARTMENT_OTHER): Payer: Commercial Managed Care - PPO | Admitting: Hematology and Oncology

## 2016-05-10 VITALS — BP 111/59 | HR 55 | Temp 97.8°F | Resp 18 | Ht 68.0 in | Wt 147.8 lb

## 2016-05-10 DIAGNOSIS — C9110 Chronic lymphocytic leukemia of B-cell type not having achieved remission: Secondary | ICD-10-CM

## 2016-05-10 DIAGNOSIS — Z85828 Personal history of other malignant neoplasm of skin: Secondary | ICD-10-CM

## 2016-05-10 LAB — CBC WITH DIFFERENTIAL/PLATELET
BASO%: 0.2 % (ref 0.0–2.0)
Basophils Absolute: 0 10*3/uL (ref 0.0–0.1)
EOS%: 0.8 % (ref 0.0–7.0)
Eosinophils Absolute: 0.2 10*3/uL (ref 0.0–0.5)
HEMATOCRIT: 41.7 % (ref 38.4–49.9)
HEMOGLOBIN: 13.6 g/dL (ref 13.0–17.1)
LYMPH#: 17.7 10*3/uL — AB (ref 0.9–3.3)
LYMPH%: 78.4 % — ABNORMAL HIGH (ref 14.0–49.0)
MCH: 29.1 pg (ref 27.2–33.4)
MCHC: 32.6 g/dL (ref 32.0–36.0)
MCV: 89.1 fL (ref 79.3–98.0)
MONO#: 0.6 10*3/uL (ref 0.1–0.9)
MONO%: 2.5 % (ref 0.0–14.0)
NEUT%: 18.1 % — ABNORMAL LOW (ref 39.0–75.0)
NEUTROS ABS: 4.1 10*3/uL (ref 1.5–6.5)
Platelets: 242 10*3/uL (ref 140–400)
RBC: 4.68 10*6/uL (ref 4.20–5.82)
RDW: 13.5 % (ref 11.0–14.6)
WBC: 22.6 10*3/uL — AB (ref 4.0–10.3)
nRBC: 0 % (ref 0–0)

## 2016-05-10 LAB — TECHNOLOGIST REVIEW

## 2016-05-10 NOTE — Assessment & Plan Note (Signed)
He is at risk of skin cancer due to CLL I recommend skin protection and regular dermatology follow-up   

## 2016-05-10 NOTE — Telephone Encounter (Signed)
Gave and pritned appt sched and avs fo rpt for June 2018

## 2016-05-10 NOTE — Progress Notes (Signed)
[place] OFFICE PROGRESS NOTE  Patient Care Team: No Pcp Per Patient as PCP - General (General Practice)  SUMMARY OF ONCOLOGIC HISTORY:  Douglas [name] was transferred to my care after his prior physician has left.  I reviewed the patient's records extensive and collaborated the history with the patient. 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 He has no symptoms and was observed. He denies new lymphadenopathy. He follows closely with dermatology. He never have surveillance colonoscopy or prostate exams nor vaccinations as he does not "believe" in them He does not want routine vaccination either The patient decided have a primary care doctor.  INTERVAL HISTORY: Please see below for problem oriented charting. He appears well. No new lymphadenopathy. Denies night sweats or skin changes. REVIEW OF SYSTEMS:   Constitutional: Denies fevers, chills or abnormal weight loss Eyes: Denies blurriness of vision Ears, nose, mouth, throat, and face: Denies mucositis or sore throat Respiratory: Denies cough, dyspnea or wheezes Cardiovascular: Denies palpitation, chest discomfort or lower extremity swelling Gastrointestinal:  Denies nausea, heartburn or change in bowel habits Skin: Denies abnormal skin rashes Lymphatics: Denies new lymphadenopathy or easy bruising Neurological:Denies numbness, tingling or new weaknesses Behavioral/Psych: Mood is stable, no new changes  All other systems were reviewed with the patient and are negative.  I have reviewed the past medical history, past surgical history, social history and family history with the patient and they are unchanged from previous note.  ALLERGIES:  is allergic to sulfa antibiotics.  MEDICATIONS:  No current outpatient prescriptions on file.   No current facility-administered medications for this visit.    PHYSICAL EXAMINATION: ECOG PERFORMANCE STATUS: 0 -  Asymptomatic  Filed Vitals:   05/10/16 0854  BP: 111/59  Pulse: 55  Temp: 97.8 F (36.6 C)  Resp: 18   Filed Weights   05/10/16 0854  Weight: 147 lb 12.8 oz (67.042 kg)    GENERAL:alert, no distress and comfortable SKIN: skin color, texture, turgor are normal, no rashes or significant lesions. No suspicious skin lesions EYES: normal, Conjunctiva are pink and non-injected, sclera clear OROPHARYNX:no exudate, no erythema and lips, buccal mucosa, and tongue normal  NECK: supple, thyroid normal size, non-tender, without nodularity LYMPH:  no palpable lymphadenopathy in the cervical, axillary or inguinal LUNGS: clear to auscultation and percussion with normal breathing effort HEART: regular rate & rhythm and no murmurs and no lower extremity edema ABDOMEN:abdomen soft, non-tender and normal bowel sounds Musculoskeletal:no cyanosis of digits and no clubbing  NEURO: alert & oriented x 3 with fluent speech, no focal motor/sensory deficits  LABORATORY DATA:  I have reviewed the data as listed    Component Value Date/Time   NA 141 05/12/2015 0917   NA 142 03/21/2012 1007   K 5.1 05/12/2015 0917   K 4.6 03/21/2012 1007   CL 105 11/06/2012 0941   CL 106 03/21/2012 1007   CO2 30* 05/12/2015 0917   CO2 30 03/21/2012 1007   GLUCOSE 113 05/12/2015 0917   GLUCOSE 105* 11/06/2012 0941   GLUCOSE 110* 03/21/2012 1007   BUN 17.1 05/12/2015 0917   BUN 17 03/21/2012 1007   CREATININE 1.0 05/12/2015 0917   CREATININE 1.00 03/21/2012 1007   CALCIUM 9.5 05/12/2015 0917   CALCIUM 9.3 03/21/2012 1007   PROT 6.5 05/12/2015 0917   PROT 6.4 03/21/2012 1007   ALBUMIN 3.7 05/12/2015 0917   ALBUMIN 4.1 03/21/2012 1007   AST 18 05/12/2015 0917  AST 17 03/21/2012 1007   ALT 12 05/12/2015 0917   ALT 13 03/21/2012 1007   ALKPHOS 65 05/12/2015 0917   ALKPHOS 55 03/21/2012 1007   BILITOT 2.12* 05/12/2015 0917   BILITOT 1.7* 03/21/2012 1007    No results found for: SPEP, UPEP  Lab Results   Component Value Date   WBC 22.6* 05/10/2016   NEUTROABS 4.1 05/10/2016   HGB 13.6 05/10/2016   HCT 41.7 05/10/2016   MCV 89.1 05/10/2016   PLT 242 05/10/2016      Chemistry      Component Value Date/Time   NA 141 05/12/2015 0917   NA 142 03/21/2012 1007   K 5.1 05/12/2015 0917   K 4.6 03/21/2012 1007   CL 105 11/06/2012 0941   CL 106 03/21/2012 1007   CO2 30* 05/12/2015 0917   CO2 30 03/21/2012 1007   BUN 17.1 05/12/2015 0917   BUN 17 03/21/2012 1007   CREATININE 1.0 05/12/2015 0917   CREATININE 1.00 03/21/2012 1007      Component Value Date/Time   CALCIUM 9.5 05/12/2015 0917   CALCIUM 9.3 03/21/2012 1007   ALKPHOS 65 05/12/2015 0917   ALKPHOS 55 03/21/2012 1007   AST 18 05/12/2015 0917   AST 17 03/21/2012 1007   ALT 12 05/12/2015 0917   ALT 13 03/21/2012 1007   BILITOT 2.12* 05/12/2015 0917   BILITOT 1.7* 03/21/2012 1007      ASSESSMENT & PLAN:  CLL (chronic lymphocytic leukemia) He has no signs and symptoms of disease progression I reinforced the importance of preventive vaccination and age appropriate screening programs However, the patient declined. I will see him on an annual basis with history, physical examination and blood work  History of skin cancer He is at risk of skin cancer due to CLL I recommend skin protection and regular dermatology follow-up     Orders Placed This Encounter  Procedures  . CBC with Differential/Platelet    Standing Status: Future     Number of Occurrences:      Standing Expiration Date: 06/14/2017   All questions were answered. The patient knows to call the clinic with any problems, questions or concerns. No barriers to learning was detected. I spent 15 minutes counseling the patient face to face. The total time spent in the appointment was 20 minutes and more than 50% was on counseling and review of test results     [facility], [place], MD 05/10/2016 9:37 AM

## 2016-05-10 NOTE — Assessment & Plan Note (Signed)
He has no signs and symptoms of disease progression I reinforced the importance of preventive vaccination and age appropriate screening programs However, the patient declined. I will see him on an annual basis with history, physical examination and blood work  

## 2016-05-30 ENCOUNTER — Telehealth: Payer: Self-pay | Admitting: *Deleted

## 2016-05-30 ENCOUNTER — Ambulatory Visit (INDEPENDENT_AMBULATORY_CARE_PROVIDER_SITE_OTHER): Payer: Commercial Managed Care - PPO | Admitting: Family Medicine

## 2016-05-30 VITALS — BP 128/68 | HR 58 | Temp 98.1°F | Resp 16 | Ht 66.5 in | Wt 145.2 lb

## 2016-05-30 DIAGNOSIS — C9110 Chronic lymphocytic leukemia of B-cell type not having achieved remission: Secondary | ICD-10-CM | POA: Diagnosis not present

## 2016-05-30 DIAGNOSIS — Z1329 Encounter for screening for other suspected endocrine disorder: Secondary | ICD-10-CM | POA: Diagnosis not present

## 2016-05-30 DIAGNOSIS — Z0000 Encounter for general adult medical examination without abnormal findings: Secondary | ICD-10-CM | POA: Diagnosis not present

## 2016-05-30 DIAGNOSIS — Z1212 Encounter for screening for malignant neoplasm of rectum: Secondary | ICD-10-CM

## 2016-05-30 DIAGNOSIS — Z1383 Encounter for screening for respiratory disorder NEC: Secondary | ICD-10-CM

## 2016-05-30 DIAGNOSIS — Z136 Encounter for screening for cardiovascular disorders: Secondary | ICD-10-CM | POA: Diagnosis not present

## 2016-05-30 DIAGNOSIS — Z1389 Encounter for screening for other disorder: Secondary | ICD-10-CM

## 2016-05-30 DIAGNOSIS — Z1211 Encounter for screening for malignant neoplasm of colon: Secondary | ICD-10-CM | POA: Diagnosis not present

## 2016-05-30 DIAGNOSIS — Z113 Encounter for screening for infections with a predominantly sexual mode of transmission: Secondary | ICD-10-CM

## 2016-05-30 DIAGNOSIS — Z23 Encounter for immunization: Secondary | ICD-10-CM | POA: Diagnosis not present

## 2016-05-30 DIAGNOSIS — R17 Unspecified jaundice: Secondary | ICD-10-CM

## 2016-05-30 LAB — POCT URINALYSIS DIP (MANUAL ENTRY)
BILIRUBIN UA: NEGATIVE
GLUCOSE UA: NEGATIVE
Leukocytes, UA: NEGATIVE
Nitrite, UA: NEGATIVE
Protein Ur, POC: NEGATIVE
RBC UA: NEGATIVE
SPEC GRAV UA: 1.025
Urobilinogen, UA: 0.2
pH, UA: 5.5

## 2016-05-30 NOTE — Patient Instructions (Addendum)
IF you received an x-ray today, you will receive an invoice from [facility]. Please contact [facility] at [phone] with questions or concerns regarding your invoice.   IF you received labwork today, you will receive an invoice from [employer]. Please contact [facility] at [phone] with questions or concerns regarding your invoice.   Our billing staff will not be able to assist you with questions regarding bills from these companies.  You will be contacted with the lab results as soon as they are available. The fastest way to get your results is to activate your My Chart account. Instructions are located on the last page of this paperwork. If you have not heard from [place] regarding the results in 2 weeks, please contact this office.     Keeping you healthy  Get these tests  Blood pressure- Have your blood pressure checked once a year by your healthcare provider.  Normal blood pressure is 120/80  Weight- Have your body mass index (BMI) calculated to screen for obesity.  BMI is a measure of body fat based on height and weight. You can also calculate your own BMI at [employer][email or web address].  Cholesterol- Have your cholesterol checked every year.  Diabetes- Have your blood sugar checked regularly if you have high blood pressure, high cholesterol, have a family history of diabetes or if you are overweight.  Screening for Colon Cancer- Colonoscopy starting at age 108.  Screening may begin sooner depending on your family history and other health conditions. Follow up colonoscopy as directed by your Gastroenterologist.  Screening for Prostate Cancer- Both blood work (PSA) and a rectal exam help screen for Prostate Cancer.  Screening begins at age 41 with African-American men and at age 17 with Caucasian men.  Screening may begin sooner depending on your family history.  Take these medicines  Aspirin- One aspirin daily can help prevent  Heart disease and Stroke.  Flu shot- Every fall.  Tetanus- Every 10 years.  Zostavax- Once after the age of 39 to prevent Shingles.  Pneumonia shot- Once after the age of 86; if you are younger than 57, ask your healthcare provider if you need a Pneumonia shot.  Take these steps  Don't smoke- If you do smoke, talk to your doctor about quitting.  For tips on how to quit, go to www.smokefree.gov or call 1-800-QUIT-NOW.  Be physically active- Exercise 5 days a week for at least 30 minutes.  If you are not already physically active start slow and gradually work up to 30 minutes of moderate physical activity.  Examples of moderate activity include walking briskly, mowing the yard, dancing, swimming, bicycling, etc.  Eat a healthy diet- Eat a variety of healthy food such as fruits, vegetables, low fat milk, low fat cheese, yogurt, lean meant, poultry, fish, beans, tofu, etc. For more information go to www.thenutritionsource.org  Drink alcohol in moderation- Limit alcohol intake to less than two drinks a day. Never drink and drive.  Dentist- Brush and floss twice daily; visit your dentist twice a year.  Depression- Your emotional health is as important as your physical health. If you're feeling down, or losing interest in things you would normally enjoy please talk to your healthcare provider.  Eye exam- Visit your eye doctor every year.  Safe sex- If you may be exposed to a sexually transmitted infection, use a condom.  Seat belts- Seat belts can save your life; always wear one.  Smoke/Carbon Monoxide detectors- These detectors need to be installed  on the appropriate level of your home.  Replace batteries at least once a year.  Skin cancer- When out in the sun, cover up and use sunscreen 15 SPF or higher.  Violence- If anyone is threatening you, please tell your healthcare provider.  Living Will/ Health care power of attorney- Speak with your healthcare provider and family.     Why  follow it? Research shows. . Those who follow the Mediterranean diet have a reduced risk of heart disease  . The diet is associated with a reduced incidence of Parkinson's and Alzheimer's diseases . People following the diet may have longer life expectancies and lower rates of chronic diseases  . The Dietary Guidelines for Americans recommends the Mediterranean diet as an eating plan to promote health and prevent disease  What Is the Mediterranean Diet?  . Healthy eating plan based on typical foods and recipes of Mediterranean-style cooking . The diet is primarily a plant based diet; these foods should make up a majority of meals   Starches - Plant based foods should make up a majority of meals - They are an important sources of vitamins, minerals, energy, antioxidants, and fiber - Choose whole grains, foods high in fiber and minimally processed items  - Typical grain sources include wheat, oats, barley, corn, brown rice, bulgar, farro, millet, polenta, couscous  - Various types of beans include chickpeas, lentils, fava beans, black beans, white beans   Fruits  Veggies - Large quantities of antioxidant rich fruits & veggies; 6 or more servings  - Vegetables can be eaten raw or lightly drizzled with oil and cooked  - Vegetables common to the traditional Mediterranean Diet include: artichokes, arugula, beets, broccoli, brussel sprouts, cabbage, carrots, celery, collard greens, cucumbers, eggplant, kale, leeks, lemons, lettuce, mushrooms, okra, onions, peas, peppers, potatoes, pumpkin, radishes, rutabaga, shallots, spinach, sweet potatoes, turnips, zucchini - Fruits common to the Mediterranean Diet include: apples, apricots, avocados, cherries, clementines, dates, figs, grapefruits, grapes, melons, nectarines, oranges, peaches, pears, pomegranates, strawberries, tangerines  Fats - Replace butter and margarine with healthy oils, such as olive oil, canola oil, and tahini  - Limit nuts to no more than  a handful a day  - Nuts include walnuts, almonds, pecans, pistachios, pine nuts  - Limit or avoid candied, honey roasted or heavily salted nuts - Olives are central to the [employer] - can be eaten whole or used in a variety of dishes   Meats Protein - Limiting red meat: no more than a few times a month - When eating red meat: choose lean cuts and keep the portion to the size of deck of cards - Eggs: approx. 0 to 4 times a week  - Fish and lean poultry: at least 2 a week  - Healthy protein sources include, chicken, [place], lean beef, lamb - Increase intake of seafood such as tuna, salmon, trout, mackerel, shrimp, scallops - Avoid or limit high fat processed meats such as sausage and bacon  Dairy - Include moderate amounts of low fat dairy products  - Focus on healthy dairy such as fat free yogurt, skim milk, low or reduced fat cheese - Limit dairy products higher in fat such as whole or 2% milk, cheese, ice cream  Alcohol - Moderate amounts of red wine is ok  - No more than 5 oz daily for women (all ages) and men older than age 42  - No more than 10 oz of wine daily for men younger than 76  Other - Limit  sweets and other desserts  - Use herbs and spices instead of salt to flavor foods  - Herbs and spices common to the traditional Mediterranean Diet include: basil, bay leaves, chives, cloves, cumin, fennel, garlic, lavender, marjoram, mint, oregano, parsley, pepper, rosemary, sage, savory, sumac, tarragon, thyme   It's not just a diet, it's a lifestyle:  . The Mediterranean diet includes lifestyle factors typical of those in the region  . Foods, drinks and meals are best eaten with others and savored . Daily physical activity is important for overall good health . This could be strenuous exercise like running and aerobics . This could also be more leisurely activities such as walking, housework, yard-work, or taking the stairs . Moderation is the key; a balanced and healthy diet  accommodates most foods and drinks . Consider portion sizes and frequency of consumption of certain foods   Meal Ideas & Options:  . Breakfast:  o Whole wheat toast or whole wheat English muffins with peanut butter & hard boiled egg o Steel cut oats topped with apples & cinnamon and skim milk  o Fresh fruit: banana, strawberries, melon, berries, peaches  o Smoothies: strawberries, bananas, greek yogurt, peanut butter o Low fat greek yogurt with blueberries and granola  o Egg white omelet with spinach and mushrooms o Breakfast couscous: whole wheat couscous, apricots, skim milk, cranberries  . Sandwiches:  o Hummus and grilled vegetables (peppers, zucchini, squash) on whole wheat bread   o Grilled chicken on whole wheat pita with lettuce, tomatoes, cucumbers or tzatziki  o Tuna salad on whole wheat bread: tuna salad made with greek yogurt, olives, red peppers, capers, green onions o Garlic rosemary lamb pita: lamb sauted with garlic, rosemary, salt & pepper; add lettuce, cucumber, greek yogurt to pita - flavor with lemon juice and black pepper  . Seafood:  o Mediterranean grilled salmon, seasoned with garlic, basil, parsley, lemon juice and black pepper o Shrimp, lemon, and spinach whole-grain pasta salad made with low fat greek yogurt  o Seared scallops with lemon orzo  o Seared tuna steaks seasoned salt, pepper, coriander topped with tomato mixture of olives, tomatoes, olive oil, minced garlic, parsley, green onions and cappers  . Meats:  o Herbed greek chicken salad with kalamata olives, cucumber, feta  o Red bell peppers stuffed with spinach, bulgur, lean ground beef (or lentils) & topped with feta   o Kebabs: skewers of chicken, tomatoes, onions, zucchini, squash  o [place] burgers: made with red onions, mint, dill, lemon juice, feta cheese topped with roasted red peppers . Vegetarian o Cucumber salad: cucumbers, artichoke hearts, celery, red onion, feta cheese, tossed in olive oil &  lemon juice  o Hummus and whole grain pita points with a greek salad (lettuce, tomato, feta, olives, cucumbers, red onion) o Lentil soup with celery, carrots made with vegetable broth, garlic, salt and pepper  o Tabouli salad: parsley, bulgur, mint, scallions, cucumbers, tomato, radishes, lemon juice, olive oil, salt and pepper.

## 2016-05-30 NOTE — Progress Notes (Signed)
Subjective:  By signing my name below, I, Moises Blood, attest that this documentation has been prepared under the direction and in the presence of Delman Cheadle, MD. Electronically Signed: Moises Blood, [place]. 05/30/2016 , 3:07 PM .  Patient was seen in Room 14 .   Patient ID: Douglas [name], male    DOB: Apr 10, 1946, 70 y.o.   MRN: [id number] Chief Complaint  Patient presents with  . Annual Exam    for the insurance   HPI Douglas [name] is a 71 y.o. male who presents to [facility] for annual physical.   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 He hasn't shown signs of symptoms or disease progression. He sees dermatologist annually as he has risk to skin cancer due to CLL. He was diagnosed with CLL in 2007 from lab work, but he showed no symptoms. He had an appointment with Dr. Alvy Bimler 3 weeks ago, who recommended preventative vaccinations and age appropriate screenings, but patient refused. He has not had history of surveillance colonoscopy, cancer screenings, or vaccinations, as "he does not believe in them".   He agrees to a tetanus shot today. He declined colonoscopy, other vaccinations or cancer screening today. He denies any urinary symptoms. He denies any GI symptoms. He feels fatigue but notes sleeping late and waking up early.   He informs liver problems since he was young. His bilirubin was last checked a year ago. He denies taking any supplements or medications. He eats plenty of fruit, some vegetables and meat. He eats plenty of fish. He tries to avoid fried foods.   Patient notes having a sebaceous cyst over the back of his neck. He's mentioned this to Dr. Alvy Bimler in the past and was informed it to be benign.   He works Pharmacist, hospital for 38 years.   Past Medical History  Diagnosis Date  . Leukemia ([place])   . Hyperbilirubinemia   . Onychomycosis   . 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 Basal cell carcinoma of right temple region   . Leukemia,  chronic lymphocytic ([place])    Prior to Admission medications   Not on File   Allergies  Allergen Reactions  . Sulfa Antibiotics Nausea Only and Rash   Past Surgical History  Procedure Laterality Date  . Squamous cell carcinoma excision  2006  . Basal cell carcinoma excison  2012   Family History  Problem Relation Age of Onset  . Alzheimer's disease Mother   . Hypertension Father   . Diabetes Father   . Heart attack Father   . Heart attack Brother   . Cancer Brother     CLL   Social History   Social History  . Marital Status: Married    Spouse Name: N/A  . Number of Children: N/A  . Years of Education: N/A   Social History Main Topics  . Smoking status: Never Smoker   . Smokeless tobacco: Never Used  . Alcohol Use: No  . Drug Use: No  . Sexual Activity: Not Asked     Comment: married, he is retired   Other Topics Concern  . None   Social History Narrative    Review of Systems  Constitutional: Positive for fatigue. Negative for fever and chills.  Respiratory: Negative for cough, shortness of breath and wheezing.   Gastrointestinal: Negative for nausea, vomiting, diarrhea and constipation.  Genitourinary: Negative for dysuria, urgency, frequency and hematuria.  All other systems reviewed and are negative.  Objective:   Physical Exam  Constitutional: He is oriented to person, place, and time. He appears well-developed and well-nourished. No distress.  HENT:  Head: Normocephalic and atraumatic.  Right Ear: Tympanic membrane normal.  Left Ear: Tympanic membrane normal.  Nose: Nose normal.  Mouth/Throat: Oropharynx is clear and moist.  Eyes: EOM are normal. Pupils are equal, round, and reactive to light.  Neck: Neck supple. No thyromegaly present.  2cm soft, non tender, mobile subcutaneous mass on left posterior neck  Cardiovascular: Normal rate, regular rhythm, S1 normal, S2 normal and normal heart sounds.   No murmur heard. Pulmonary/Chest: Effort  normal and breath sounds normal. No respiratory distress.  Abdominal: Soft. Bowel sounds are normal. He exhibits no distension. There is no hepatosplenomegaly. There is no tenderness.  Abdominal aorta faintly palpable, does not feel enlarged  Musculoskeletal: Normal range of motion.  Lymphadenopathy:       Head (right side): No submandibular adenopathy present.       Head (left side): No submandibular adenopathy present.       Right: No supraclavicular adenopathy present.       Left: No supraclavicular adenopathy present.  Neurological: He is alert and oriented to person, place, and time.  Skin: Skin is warm and dry.  Psychiatric: He has a normal mood and affect. His behavior is normal.  Nursing note and vitals reviewed.   BP 128/68 mmHg  Pulse 58  Temp(Src) 98.1 F (36.7 C) (Oral)  Resp 16  Ht 5' 6.5" (1.689 m)  Wt 145 lb 3.2 oz (65.862 kg)  BMI 23.09 kg/m2  SpO2 96%    Assessment & Plan:   1. Annual physical exam   2. Routine screening for STI (sexually transmitted infection)   3. Screening for cardiovascular, respiratory, and genitourinary diseases   4. Screening for colorectal cancer   5. Screening for thyroid disorder   6. 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 CLL (chronic lymphocytic leukemia) (HCC) - stable, follows with oncology regularly but has not had any problems with it.   Pt steadfastly declines most age-indicated screening test such as colorectal cancer screen, prostate screen, and AAA screen - because of the CLL diagnosis which he feels he would have been better off not knowing he just wants to let his life and nature take its course and not go looking for problems when he doesn't have any sxs.  This choice has been documented multiple times in chart prior as well.  Orders Placed This Encounter  Procedures  . Tdap vaccine greater than or equal to 7yo IM  . Comprehensive  metabolic panel    Order Specific Question:  Has the patient fasted?    Answer:  Yes  . Lipid panel    Order Specific Question:  Has the patient fasted?    Answer:  Yes  . TSH  . HIV antibody  . Hepatitis C Antibody  . Comprehensive metabolic panel  . Lipid panel  . TSH  . Care order/instruction    AVS - please print after vaccine is given, after labs and urine are collected, pt can go  . POCT urinalysis dipstick    I personally performed the services described in this documentation, which was scribed in my presence. The recorded information has been reviewed and considered, and addended by me as needed.   Delman Cheadle, M.D.  [facility] [address]  [place], [place] [address] [phone] phone [phone] fax  06/05/2016 7:56 AM

## 2016-05-30 NOTE — Telephone Encounter (Signed)
Faxed signed authorization to release medical records (lab/imaging) only, per Dr Brigitte Pulse. Confirmation page received at [phone].

## 2016-05-31 LAB — COMPREHENSIVE METABOLIC PANEL
ALBUMIN: 4.4 g/dL (ref 3.6–5.1)
ALK PHOS: 55 U/L (ref 40–115)
ALT: 10 U/L (ref 9–46)
AST: 14 U/L (ref 10–35)
BILIRUBIN TOTAL: 2.1 mg/dL — AB (ref 0.2–1.2)
BUN: 20 mg/dL (ref 7–25)
CALCIUM: 9.1 mg/dL (ref 8.6–10.3)
CO2: 29 mmol/L (ref 20–31)
CREATININE: 0.93 mg/dL (ref 0.70–1.18)
Chloride: 103 mmol/L (ref 98–110)
Glucose, Bld: 87 mg/dL (ref 65–99)
Potassium: 4.1 mmol/L (ref 3.5–5.3)
SODIUM: 140 mmol/L (ref 135–146)
TOTAL PROTEIN: 6.7 g/dL (ref 6.1–8.1)

## 2016-05-31 LAB — LIPID PANEL
Cholesterol: 196 mg/dL (ref 125–200)
HDL: 53 mg/dL (ref >=40)
LDL CALC: 134 mg/dL — AB (ref <130)
TRIGLYCERIDES: 46 mg/dL (ref <150)
Total CHOL/HDL Ratio: 3.7 Ratio (ref <=5.0)
VLDL: 9 mg/dL (ref <30)

## 2016-05-31 LAB — HEPATITIS C ANTIBODY: HCV AB: NEGATIVE

## 2016-05-31 LAB — HIV ANTIBODY (ROUTINE TESTING W REFLEX): HIV 1&2 Ab, 4th Generation: NONREACTIVE

## 2016-06-01 LAB — COMPREHENSIVE METABOLIC PANEL
ALBUMIN: 4.4 g/dL (ref 3.6–5.1)
ALT: 10 U/L (ref 9–46)
AST: 16 U/L (ref 10–35)
Alkaline Phosphatase: 58 U/L (ref 40–115)
BILIRUBIN TOTAL: 2.1 mg/dL — AB (ref 0.2–1.2)
BUN: 20 mg/dL (ref 7–25)
CALCIUM: 9.2 mg/dL (ref 8.6–10.3)
CO2: 25 mmol/L (ref 20–31)
Chloride: 102 mmol/L (ref 98–110)
Creat: 0.9 mg/dL (ref 0.70–1.18)
GLUCOSE: 87 mg/dL (ref 65–99)
POTASSIUM: 4.2 mmol/L (ref 3.5–5.3)
Sodium: 141 mmol/L (ref 135–146)
Total Protein: 6.7 g/dL (ref 6.1–8.1)

## 2016-06-01 LAB — LIPID PANEL
Cholesterol: 211 mg/dL — ABNORMAL HIGH (ref 125–200)
HDL: 55 mg/dL (ref >=40)
LDL CALC: 147 mg/dL — AB (ref <130)
TRIGLYCERIDES: 47 mg/dL (ref <150)
Total CHOL/HDL Ratio: 3.8 Ratio (ref <=5.0)
VLDL: 9 mg/dL (ref <30)

## 2016-06-01 LAB — TSH
TSH: 4.18 mIU/L (ref 0.40–4.50)
TSH: 4.3 m[IU]/L (ref 0.40–4.50)

## 2017-04-07 ENCOUNTER — Encounter: Payer: Self-pay | Admitting: Family Medicine

## 2017-04-07 ENCOUNTER — Ambulatory Visit (HOSPITAL_COMMUNITY)
Admission: EM | Admit: 2017-04-07 | Discharge: 2017-04-08 | Disposition: A | Discharge status: Home / Self Care | Payer: Commercial Managed Care - PPO | Attending: Internal Medicine | Admitting: Internal Medicine

## 2017-04-07 ENCOUNTER — Ambulatory Visit (INDEPENDENT_AMBULATORY_CARE_PROVIDER_SITE_OTHER): Payer: Commercial Managed Care - PPO | Admitting: Family Medicine

## 2017-04-07 ENCOUNTER — Encounter (HOSPITAL_COMMUNITY): Payer: Self-pay

## 2017-04-07 ENCOUNTER — Ambulatory Visit (HOSPITAL_COMMUNITY)
Admission: EM | Disposition: A | Discharge status: Home / Self Care | Payer: Self-pay | Source: Home / Self Care | Attending: Emergency Medicine

## 2017-04-07 ENCOUNTER — Emergency Department (HOSPITAL_COMMUNITY): Payer: Commercial Managed Care - PPO

## 2017-04-07 VITALS — BP 138/59 | HR 32 | Temp 98.2°F | Resp 12 | Ht 66.5 in | Wt 155.0 lb

## 2017-04-07 DIAGNOSIS — I442 Atrioventricular block, complete: Secondary | ICD-10-CM

## 2017-04-07 DIAGNOSIS — I4510 Unspecified right bundle-branch block: Secondary | ICD-10-CM | POA: Diagnosis not present

## 2017-04-07 DIAGNOSIS — I10 Essential (primary) hypertension: Secondary | ICD-10-CM | POA: Insufficient documentation

## 2017-04-07 DIAGNOSIS — E785 Hyperlipidemia, unspecified: Secondary | ICD-10-CM | POA: Diagnosis present

## 2017-04-07 DIAGNOSIS — Z882 Allergy status to sulfonamides status: Secondary | ICD-10-CM | POA: Insufficient documentation

## 2017-04-07 DIAGNOSIS — Z8249 Family history of ischemic heart disease and other diseases of the circulatory system: Secondary | ICD-10-CM

## 2017-04-07 DIAGNOSIS — C9110 Chronic lymphocytic leukemia of B-cell type not having achieved remission: Secondary | ICD-10-CM | POA: Insufficient documentation

## 2017-04-07 DIAGNOSIS — Z950 Presence of cardiac pacemaker: Secondary | ICD-10-CM | POA: Diagnosis not present

## 2017-04-07 DIAGNOSIS — R030 Elevated blood-pressure reading, without diagnosis of hypertension: Secondary | ICD-10-CM | POA: Diagnosis not present

## 2017-04-07 HISTORY — PX: PACEMAKER IMPLANT: EP1218

## 2017-04-07 LAB — CBC
HEMATOCRIT: 44.3 % (ref 39.0–52.0)
HEMOGLOBIN: 14.1 g/dL (ref 13.0–17.0)
MCH: 28 pg (ref 26.0–34.0)
MCHC: 31.8 g/dL (ref 30.0–36.0)
MCV: 88.1 fL (ref 78.0–100.0)
Platelets: 187 10*3/uL (ref 150–400)
RBC: 5.03 MIL/uL (ref 4.22–5.81)
RDW: 13.9 % (ref 11.5–15.5)
WBC: 15 10*3/uL — ABNORMAL HIGH (ref 4.0–10.5)

## 2017-04-07 LAB — COMPREHENSIVE METABOLIC PANEL
ALT: 46 U/L (ref 17–63)
AST: 32 U/L (ref 15–41)
Albumin: 4.2 g/dL (ref 3.5–5.0)
Alkaline Phosphatase: 70 U/L (ref 38–126)
Anion gap: 10 (ref 5–15)
BUN: 13 mg/dL (ref 6–20)
CHLORIDE: 104 mmol/L (ref 101–111)
CO2: 24 mmol/L (ref 22–32)
CREATININE: 1.14 mg/dL (ref 0.61–1.24)
Calcium: 9.3 mg/dL (ref 8.9–10.3)
GFR calc Af Amer: >60 mL/min (ref >60)
GFR calc non Af Amer: >60 mL/min (ref >60)
Glucose, Bld: 103 mg/dL — ABNORMAL HIGH (ref 65–99)
Potassium: 4.7 mmol/L (ref 3.5–5.1)
SODIUM: 138 mmol/L (ref 135–145)
Total Bilirubin: 3.4 mg/dL — ABNORMAL HIGH (ref 0.3–1.2)
Total Protein: 6.9 g/dL (ref 6.5–8.1)

## 2017-04-07 LAB — TROPONIN I

## 2017-04-07 LAB — PROTIME-INR
INR: 0.96
Prothrombin Time: 12.8 seconds (ref 11.4–15.2)

## 2017-04-07 LAB — MAGNESIUM: Magnesium: 2.3 mg/dL (ref 1.7–2.4)

## 2017-04-07 LAB — CBG MONITORING, ED: GLUCOSE-CAPILLARY: 91 mg/dL (ref 65–99)

## 2017-04-07 SURGERY — PACEMAKER IMPLANT

## 2017-04-07 MED ORDER — ONDANSETRON HCL 4 MG/2ML IJ SOLN: 4.0000 mg | Freq: Four times a day (QID) | INTRAMUSCULAR | Status: DC | PRN

## 2017-04-07 MED ORDER — LIDOCAINE HCL (PF) 1 % IJ SOLN
INTRAMUSCULAR | Status: DC | PRN
  Administered 2017-04-07: 40 mL via INTRADERMAL

## 2017-04-07 MED ORDER — CEFAZOLIN SODIUM-DEXTROSE 2-4 GM/100ML-% IV SOLN
INTRAVENOUS | Status: AC
  Filled 2017-04-07: qty 100

## 2017-04-07 MED ORDER — SODIUM CHLORIDE 0.9 % IR SOLN
Status: AC
  Filled 2017-04-07: qty 2

## 2017-04-07 MED ORDER — SODIUM CHLORIDE 0.9 % IR SOLN
80.0000 mg | Status: AC
  Administered 2017-04-07: 80 mg

## 2017-04-07 MED ORDER — YOU HAVE A PACEMAKER BOOK
Freq: Once | Status: AC
  Administered 2017-04-07: 20:00:00
  Filled 2017-04-07: qty 1

## 2017-04-07 MED ORDER — ACETAMINOPHEN 325 MG PO TABS
325.0000 mg | ORAL_TABLET | ORAL | Status: DC | PRN
  Filled 2017-04-07: qty 2

## 2017-04-07 MED ORDER — MIDAZOLAM HCL 5 MG/5ML IJ SOLN
INTRAMUSCULAR | Status: DC | PRN
  Administered 2017-04-07 (×2): 1 mg via INTRAVENOUS

## 2017-04-07 MED ORDER — FENTANYL CITRATE (PF) 100 MCG/2ML IJ SOLN
INTRAMUSCULAR | Status: DC | PRN
  Administered 2017-04-07 (×2): 12.5 ug via INTRAVENOUS

## 2017-04-07 MED ORDER — HYDRALAZINE HCL 20 MG/ML IJ SOLN
10.0000 mg | Freq: Once | INTRAMUSCULAR | Status: AC
  Administered 2017-04-07: 10 mg via INTRAVENOUS

## 2017-04-07 MED ORDER — LIDOCAINE HCL (PF) 1 % IJ SOLN
INTRAMUSCULAR | Status: AC
  Filled 2017-04-07: qty 60

## 2017-04-07 MED ORDER — HEPARIN (PORCINE) IN NACL 2-0.9 UNIT/ML-% IJ SOLN
INTRAMUSCULAR | Status: AC
  Filled 2017-04-07: qty 1000

## 2017-04-07 MED ORDER — HYDRALAZINE HCL 20 MG/ML IJ SOLN
2.0000 mg | Freq: Once | INTRAMUSCULAR | Status: AC
  Filled 2017-04-07: qty 1

## 2017-04-07 MED ORDER — CEFAZOLIN SODIUM-DEXTROSE 2-3 GM-% IV SOLR: INTRAVENOUS | Status: DC | PRN

## 2017-04-07 MED ORDER — FENTANYL CITRATE (PF) 100 MCG/2ML IJ SOLN
INTRAMUSCULAR | Status: AC
  Filled 2017-04-07: qty 2

## 2017-04-07 MED ORDER — MIDAZOLAM HCL 5 MG/5ML IJ SOLN
INTRAMUSCULAR | Status: AC
  Filled 2017-04-07: qty 5

## 2017-04-07 MED ORDER — ASPIRIN 81 MG PO CHEW
324.0000 mg | CHEWABLE_TABLET | Freq: Once | ORAL | Status: AC
  Administered 2017-04-07: 324 mg via ORAL
  Filled 2017-04-07: qty 4

## 2017-04-07 MED ORDER — CEFAZOLIN SODIUM-DEXTROSE 1-4 GM/50ML-% IV SOLN
1.0000 g | Freq: Four times a day (QID) | INTRAVENOUS | Status: AC
  Administered 2017-04-07 – 2017-04-08 (×3): 1 g via INTRAVENOUS
  Filled 2017-04-07 (×3): qty 50

## 2017-04-07 MED ORDER — CEFAZOLIN SODIUM-DEXTROSE 2-4 GM/100ML-% IV SOLN
2.0000 g | INTRAVENOUS | Status: AC
  Administered 2017-04-07: 2 g via INTRAVENOUS

## 2017-04-07 SURGICAL SUPPLY — 7 items
CABLE SURGICAL S-101-97-12 (CABLE) ×2 IMPLANT
LEAD TENDRIL MRI 46CM LPA1200M (Lead) ×2 IMPLANT
LEAD TENDRIL MRI 52CM LPA1200M (Lead) ×2 IMPLANT
PACEMAKER ASSURITY DR-RF (Pacemaker) ×2 IMPLANT
PAD DEFIB LIFELINK (PAD) ×2 IMPLANT
SHEATH CLASSIC 9F (SHEATH) ×4 IMPLANT
TRAY PACEMAKER INSERTION (PACKS) ×2 IMPLANT

## 2017-04-07 NOTE — ED Notes (Signed)
Cardiology at the bedside.

## 2017-04-07 NOTE — Care Management Note (Signed)
Case Management Note  Patient Details  Name: Douglas [name] MRN: [id number] Date of Birth: September 30, 1946  Subjective/Objective:   From home s/p pace maker implant.                Action/Plan: NCM will cont to follow for dc needs.  Expected Discharge Date:                  Expected Discharge Plan:  Home/Self Care  In-House Referral:     Discharge planning Services  CM Consult  Post Acute Care Choice:    Choice offered to:     DME Arranged:    DME Agency:     HH Arranged:    HH Agency:     Status of Service:  In process, will continue to follow  If discussed at Long Length of Stay Meetings, dates discussed:    Additional Comments:  Zenon Mayo, RN 04/07/2017, 3:57 PM

## 2017-04-07 NOTE — Progress Notes (Addendum)
    Received page from RN at 5:43 pm that the patients blood pressures have been 381/017'P systolic. He is not on any BP medication, here for heart block with plan to get pacemaker, 2mg  IV Hydralazine once ordered.

## 2017-04-07 NOTE — ED Notes (Signed)
Xray at the bedside.

## 2017-04-07 NOTE — ED Provider Notes (Signed)
[facility] DEPT Provider Note   CSN: [id number] Arrival date & time: 04/07/17  1102     History   Chief Complaint Chief Complaint  Patient presents with  . Bradycardia    HPI Douglas [name] is a 71 y.o. male.  HPI  Patient presents with concern of dyspnea and fatigue on exertion, as well as bradycardia. 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 However, over the past few weeks the patient has noticed new dyspnea with exertion, fatigue with exertion, without pain. He has checked his pulse multiple times, found it to be low during this time course. Today, the patient had outpatient evaluation, and after he was found to have persistent bradycardia he was sent here for evaluation. He denies other recent medication changes, diet changes, activity changes. Notably, the patient was working in [place], drove home yesterday  Past Medical History:  Diagnosis Date  . Basal cell carcinoma of right temple region   . Hyperbilirubinemia   . Leukemia ([place])   . Leukemia, chronic lymphocytic ([place])   . Onychomycosis   . 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 History of skin cancer 05/13/2015  . High bilirubin 05/13/2015  . CLL (chronic lymphocytic leukemia) ([place]) 11/06/2012    Past Surgical History:  Procedure Laterality Date  . Basal Cell Carcinoma Excison  2012  . SQUAMOUS CELL CARCINOMA EXCISION  2006       Home Medications    Prior to Admission medications   Not on File    Family History Family History  Problem Relation Age of Onset  . Alzheimer's disease Mother   . Hypertension Father   . Diabetes Father   . Heart attack Father   . Heart attack Brother   . Cancer Brother     CLL    Social History Social History  Substance Use Topics  . Smoking status: Never Smoker  . Smokeless  tobacco: Never Used  . Alcohol use No     Allergies   Sulfa antibiotics   Review of Systems Review of Systems  Constitutional:       Per HPI, otherwise negative  HENT:       Per HPI, otherwise negative  Respiratory:       Per HPI, otherwise negative  Cardiovascular:       Per HPI, otherwise negative  Gastrointestinal: Negative for vomiting.  Endocrine:       Negative aside from HPI  Genitourinary:       Neg aside from HPI   Musculoskeletal:       Per HPI, otherwise negative  Skin: Negative.   Neurological: Negative for syncope.     Physical Exam Updated Vital Signs BP (!) 167/76   Pulse (!) 42   Temp 98.1 F (36.7 C) (Temporal)   Resp 20   Ht 5' 6.5" (1.689 m)   Wt 155 lb (70.3 kg)   SpO2 99%   BMI 24.64 kg/m   Physical Exam  Constitutional: He is oriented to person, place, and time. He appears well-developed. No distress.  HENT:  Head: Normocephalic and atraumatic.  Eyes: Conjunctivae and EOM are normal.  Cardiovascular: Regular rhythm.  Bradycardia present.   Pulmonary/Chest: Effort normal. No stridor. No respiratory distress.  Abdominal: He exhibits no distension.  Musculoskeletal: He exhibits no edema.  Neurological: He  is alert and oriented to person, place, and time.  Skin: Skin is warm and dry.  Psychiatric: He has a normal mood and affect.  Nursing note and vitals reviewed.    ED Treatments / Results  Labs (all labs ordered are listed, but only abnormal results are displayed) Labs Reviewed  COMPREHENSIVE METABOLIC PANEL - Abnormal; Notable for the following:       Result Value   Glucose, Bld 103 (*)    Total Bilirubin 3.4 (*)    All other components within normal limits  CBC - Abnormal; Notable for the following:    WBC 15.0 (*)    All other components within normal limits  MAGNESIUM  TROPONIN I  PROTIME-INR  CBG MONITORING, ED    EKG  EKG Interpretation  Date/Time:  Friday Apr 07 2017 11:07:32 EDT Ventricular Rate:  33 PR  Interval:    QRS Duration: 146 QT Interval:  537 QTC Calculation: 398 R Axis:   95 Text Interpretation:  Junctional rhythm with complete heart block RBBB and LPFB Artifact Abnormal ekg Confirmed by Carmin Muskrat  MD [phone]) on 04/07/2017 11:18:13 AM       Cardiac monitor with regular narrow complex bradycardia, rate 30s, abnormal   Radiology Dg Chest Portable 1 View  Result Date: 04/07/2017 CLINICAL DATA:  Heart block EXAM: PORTABLE CHEST 1 VIEW COMPARISON:  11/24/2006 FINDINGS: Mild cardiomegaly. Lungs are clear. No effusions or edema. No acute bony abnormality. IMPRESSION: Cardiomegaly.  No active disease. Electronically Signed   By: Rolm Baptise M.D.   On: 04/07/2017 11:54    Procedures Procedures (including critical care time)  Medications Ordered in ED Medications  acetaminophen (TYLENOL) tablet 325-650 mg (not administered)  ondansetron (ZOFRAN) injection 4 mg (not administered)  ceFAZolin (ANCEF) IVPB 1 g/50 mL premix (not administered)  aspirin chewable tablet 324 mg (324 mg Oral Given 04/07/17 1124)  gentamicin (GARAMYCIN) 80 mg in sodium chloride irrigation 0.9 % 500 mL irrigation (80 mg Irrigation Given 04/07/17 1405)  ceFAZolin (ANCEF) IVPB 2g/100 mL premix (2 g Intravenous New Bag/Given 04/07/17 1335)     Initial Impression / Assessment and Plan / ED Course  I have reviewed the triage vital signs and the nursing notes.  Pertinent labs & imaging results that were available during my care of the patient were reviewed by me and considered in my medical decision making (see chart for details).     After the initial evaluation, and the patient was placed on monitoring equipment, pacer pads, initial labs, x-ray ordered, the patient's case was discussed with our cardiology colleagues can concern for heart block.  On repeat examination is in similar condition, remained bradycardic. He has been seen by our cardiology colleagues, will be admitted for pacemaker  placement.  Final Clinical Impressions(s) / ED Diagnoses  Third-degree heart block  CRITICAL CARE Performed by: Carmin Muskrat Total critical care time: 35 minutes Critical care time was exclusive of separately billable procedures and treating other patients. Critical care was necessary to treat or prevent imminent or life-threatening deterioration. Critical care was time spent personally by me on the following activities: development of treatment plan with patient and/or surrogate as well as nursing, discussions with consultants, evaluation of patient's response to treatment, examination of patient, obtaining history from patient or surrogate, ordering and performing treatments and interventions, ordering and review of laboratory studies, ordering and review of radiographic studies, pulse oximetry and re-evaluation of patient's condition.    Carmin Muskrat, MD 04/07/17 [phone]

## 2017-04-07 NOTE — H&P (Signed)
      HPI The patient is a 71 yo man with CLL who was admitted after discovering bradycardia and sob. The patient has not had syncope. The patient was found to have CHB and is on no meds to slow his heart and normally feels well. His dyspnea is class 2.  Allergies  Allergen Reactions  . Sulfa Antibiotics Nausea Only and Rash     No current facility-administered medications for this encounter.    No current outpatient prescriptions on file.     Past Medical History:  Diagnosis Date  . Basal cell carcinoma of right temple region   . Hyperbilirubinemia   . Leukemia ([place])   . Leukemia, chronic lymphocytic ([place])   . Onychomycosis   . 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   Past Surgical History:  Procedure Laterality Date  . Basal Cell Carcinoma Excison  2012  . SQUAMOUS CELL CARCINOMA EXCISION  2006     Family History  Problem Relation Age of Onset  . Alzheimer's disease Mother   . Hypertension Father   . Diabetes Father   . Heart attack Father   . Heart attack Brother   . Cancer Brother     CLL     Social History   Social History  . Marital status: Married    Spouse name: N/A  . Number of children: N/A  . Years of education: N/A   Occupational History  . Not on file.   Social History Main Topics  . Smoking status: Never Smoker  . Smokeless tobacco: Never Used  . Alcohol use No  . Drug use: No  . Sexual activity: Not on file     Comment: married, he is retired   Other Topics Concern  . Not on file   Social History Narrative  . No narrative on file     BP (!) 172/68   Pulse (!) 39   Temp 98.1 F (36.7 C) (Temporal)   Resp 11   Ht 5' 6.5" (1.689 m)   Wt 155 lb (70.3 kg)   SpO2 99%   BMI 24.64 kg/m   Physical Exam:  Well appearing 71 yo man who looks younger than his stated age and in NAD HEENT: Unremarkable Neck:  6 cm JVD, no thyromegally Lymphatics:   No adenopathy Back:  No CVA tenderness Lungs:  Clear with no wheezes HEART:  Regular brady rhythm, no murmurs, no rubs, no clicks Abd:  soft, positive bowel sounds, no organomegally, no rebound, no guarding Ext:  2 plus pulses, no edema, no cyanosis, no clubbing Skin:  No rashes no nodules Neuro:  CN II through XII intact, motor grossly intact  EKG - NSR with CHB   Assess/Plan: 1. CHB - I have discussed the treatment options with the patient and the risks/benefit/goals/expectations of PPM insertion were discussed and she wishes to proceed. 2. CLL - he appears to be in remission or at least stable. Will place an MRI PPM.  [name].D.

## 2017-04-07 NOTE — Progress Notes (Addendum)
Subjective:  This chart was scribed for Douglas Knapp, MD by Tamsen Roers, at [place] at [facility].  This patient was seen in room 7 and the patient's care was started at 10:14 AM.   Chief Complaint  Patient presents with  . low pulse     Patient ID: Douglas [name], male    DOB: 03/07/1946, 71 y.o.   MRN: [id number]  HPI HPI Comments: Douglas [name] is a 71 y.o. male who presents to Primary Care at [facility] complaining of a slow pulse and shortness of breath.  About a month ago, patient was noticing that he had shortness of breath.  Five days ago, he checked his pulse in the morning- initially couldn't find it, but then measured it to be 30.  Patient was in [facility] so he couldn't seek care right away.  He has shortness of breath during times of exertion and feels lightheaded intermittently when he stands up.   He last seen a cardiologist around when he was around the age of 35 when he was having some chest pain but there were no concerns. Denies syncope or abdominal discomfort.     Past Medical History:  Diagnosis Date  . Basal cell carcinoma of right temple region   . Hyperbilirubinemia   . Leukemia ([place])   . Leukemia, chronic lymphocytic ([place])   . Onychomycosis   . 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   No current facility-administered medications on file prior to visit.     Allergies  Allergen Reactions  . Sulfa Antibiotics Nausea Only and Rash      Review of Systems  Constitutional: Negative for chills and fever.  Eyes: Negative for pain and redness.  Respiratory: Positive for shortness of breath.   Gastrointestinal: Negative for abdominal pain, nausea and vomiting.  Neurological: Negative for syncope.       Objective:   Physical Exam  Constitutional: He is oriented to person, place, and time. He appears well-developed and well-nourished. No distress.  HENT:  Head:  Normocephalic and atraumatic.  Eyes: No scleral icterus.  Neck:  Normal thyroid No lymphadenopathy.  Well defined mobile mass on posterior left neck c/w benign lipoma.   Cardiovascular:  1+ edema to the knee on the right bilaterally.  Profound bradycardia and irregular   Pulmonary/Chest: Effort normal and breath sounds normal. No respiratory distress.  Good air movement  Neurological: He is alert and oriented to person, place, and time.  Skin: Skin is warm and dry. He is not diaphoretic.  Psychiatric: He has a normal mood and affect. His behavior is normal.   Vitals:   04/07/17 1015  BP: (!) 138/59  Pulse: (!) 32  Resp: 12  Temp: 98.2 F (36.8 C)  TempSrc: Oral  SpO2: 100%  Weight: 155 lb (70.3 kg)  Height: 5' 6.5" (1.689 m)   [facility] reading (PRIMARY) by  Dr. Brigitte Pulse. EKG: Complete heart block, rate 30 Assessment & Plan:   1. Elevated blood pressure reading   2. Complete heart block ([place])     Orders Placed This Encounter  Procedures  . EKG 12-Lead   Contact 911 for EMS transport to hosp at 10:13.  Contact [facility] - allerted triage nurse of transfer of care 10:25 EMS arrived and care handed off at 10:40  I personally performed the services described in this documentation, which was scribed in my presence. The recorded  information has been reviewed and considered, and addended by me as needed.   Delman Cheadle, M.D.  Primary Care at [facility] [address] [place], [place] [address] [phone] phone [phone] fax  04/07/17 10:25 AM

## 2017-04-07 NOTE — ED Triage Notes (Signed)
Per Pt, Pt has been noted to be bradycardic for the past month. Pt was went into [facility] and was noted to be [place] in the 70s with complete heart block on the EKG. Pt was sent here with no Hx of the same. Denies pain. Pt alert and oriented x4 upon arrival.

## 2017-04-07 NOTE — ED Notes (Signed)
Pt

## 2017-04-07 NOTE — Progress Notes (Signed)
Pt's bp mainly staying around 349'Z-791'T systolic.  Pt has never taken any meds for bp.  Tiffany greene PA notified and orders received.

## 2017-04-08 ENCOUNTER — Ambulatory Visit (HOSPITAL_COMMUNITY): Payer: Commercial Managed Care - PPO

## 2017-04-08 DIAGNOSIS — C9110 Chronic lymphocytic leukemia of B-cell type not having achieved remission: Secondary | ICD-10-CM | POA: Diagnosis not present

## 2017-04-08 DIAGNOSIS — Z950 Presence of cardiac pacemaker: Secondary | ICD-10-CM | POA: Diagnosis not present

## 2017-04-08 DIAGNOSIS — E785 Hyperlipidemia, unspecified: Secondary | ICD-10-CM | POA: Diagnosis not present

## 2017-04-08 DIAGNOSIS — I10 Essential (primary) hypertension: Secondary | ICD-10-CM | POA: Diagnosis not present

## 2017-04-08 DIAGNOSIS — I442 Atrioventricular block, complete: Secondary | ICD-10-CM | POA: Diagnosis not present

## 2017-04-08 DIAGNOSIS — Z8249 Family history of ischemic heart disease and other diseases of the circulatory system: Secondary | ICD-10-CM

## 2017-04-08 MED ORDER — ACETAMINOPHEN 325 MG PO TABS
325.0000 mg | ORAL_TABLET | ORAL | Status: DC | PRN
Start: 2017-04-08 — End: 2018-05-17

## 2017-04-08 MED ORDER — AMLODIPINE BESYLATE 5 MG PO TABS
5.0000 mg | ORAL_TABLET | Freq: Every day | ORAL | Status: DC
  Administered 2017-04-08: 5 mg via ORAL
  Filled 2017-04-08: qty 1

## 2017-04-08 NOTE — Discharge Summary (Signed)
Discharge Summary    Patient ID: Douglas [name],  MRN: [id number], DOB/AGE: [birth year] 71 y.o.  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 Sulfa Antibiotics Nausea Only and Rash    Diagnostic Studies/Procedures    St Jude pacemaker implanted 04/07/17 _____________   History of Present Illness     71 y/o with no PMH noted his HR was slow- admitted with [place] [facility]      71 y.o. male on no medications PTA with a history of CLL, admitted 04/07/17 with CHB. The pt was in a drug store checking his B/P when he noted his HR in the 30's. He admits to recent early fatigue with exertion. S/P St Jude pacemaker was placed without complications 72/4/58. He was noted to be hypertensive during his hospitalization but this improved the morning of discharge. He has an LDL of 147 and a strong FMHx of CAD. Statin Rx should be considered but the pt is reluctant to take medications. There is no history of chest pain.  _____________  Discharge Vitals Blood pressure (!) 175/66, pulse 60, temperature 98.1 F (36.7 C), temperature source Oral, resp. rate 20, height 5' 6.5" (1.689 m), weight 152 lb 5.4 oz (69.1 kg), SpO2 98 %.  Filed Weights   04/07/17 1108 04/08/17 0417  Weight: 155 lb (70.3 kg) 152 lb 5.4 oz (69.1 kg)    Labs & Radiologic Studies    CBC  Recent Labs  04/07/17 1120  WBC 15.0*  HGB 14.1  HCT 44.3  MCV 88.1  PLT 099   Basic Metabolic Panel  Recent Labs  04/07/17 1120  NA 138  K 4.7  CL 104  CO2 24  GLUCOSE 103*  BUN 13  CREATININE 1.14  CALCIUM 9.3  MG 2.3   Liver Function  Tests  Recent Labs  04/07/17 1120  AST 32  ALT 46  ALKPHOS 70  BILITOT 3.4*  PROT 6.9  ALBUMIN 4.2   No results for input(s): LIPASE, AMYLASE in the last 72 hours. Cardiac Enzymes  Recent Labs  04/07/17 1120  TROPONINI <0.03   _____________  Dg Chest 2 View  Result Date: 04/08/2017 CLINICAL DATA:  71 year old male status post pacemaker placement. EXAM: CHEST  2 VIEW COMPARISON:  04/07/2017 and earlier. FINDINGS: Left chest stool lead cardiac pacemaker placed. Leads course to the right atrium and RV apex region. No pneumothorax. Stable cardiac size at the upper limits of normal. Other mediastinal contours are within normal limits. Visualized tracheal air column is within normal limits. No pneumothorax. Small right pleural effusion. No pulmonary edema or other confluent pulmonary opacity. No acute osseous abnormality identified. Negative visible bowel gas pattern. IMPRESSION: 1. Left chest cardiac pacemaker placed with no adverse features. 2. Small right pleural effusion. No other acute cardiopulmonary abnormality. Electronically Signed   By: Genevie Ann M.D.   On: 04/08/2017 08:11   Dg Chest Portable 1 View  Result Date: 04/07/2017 CLINICAL DATA:  Heart block EXAM: PORTABLE CHEST 1 VIEW COMPARISON:  11/24/2006 FINDINGS: Mild cardiomegaly. Lungs are clear. No effusions or edema. No acute bony abnormality. IMPRESSION: Cardiomegaly.  No active disease.  Electronically Signed   By: Rolm Baptise M.D.   On: 04/07/2017 11:54   Disposition   Pt is being discharged home today in good condition.  Follow-up Plans & Appointments    Follow-up Information    Evans Lance, MD Follow up.   Specialty:  Cardiology Why:  Office will conatct you for follow up Contact information: [address] [place] [address] [phone]            Discharge Medications   Current Discharge Medication List    START taking these medications   Details  acetaminophen (TYLENOL) 325  MG tablet Take 1-2 tablets (325-650 mg total) by mouth every 4 (four) hours as needed for mild pain.         Outstanding Labs/Studies    Duration of Discharge Encounter   Greater than 30 minutes including physician time.  Angelena Form PA 04/08/2017, 10:53 AM  Pt seen and examined   Instructions give Questions answered

## 2017-04-08 NOTE — Progress Notes (Signed)
Progress Note  Patient Name: Douglas [name] Date of Encounter: 04/08/2017  Primary Cardiologist: Dr Lovena Le  Subjective   No SOB  Inpatient Medications    Scheduled Meds:  Continuous Infusions: .  ceFAZolin (ANCEF) IV Stopped (04/08/17 0158)   PRN Meds: acetaminophen, ondansetron (ZOFRAN) IV   Vital Signs    Vitals:   04/07/17 2000 04/07/17 2100 04/08/17 0417 04/08/17 0733  BP: (!) 144/47 128/62 (!) 175/66   Pulse:   60   Resp: 17 15 20    Temp: 98.4 F (36.9 C)  98.1 F (36.7 C) 98.1 F (36.7 C)  TempSrc: Oral  Oral Oral  SpO2: 96% 97% 98%   Weight:   152 lb 5.4 oz (69.1 kg)   Height:        Intake/Output Summary (Last 24 hours) at 04/08/17 0810 Last data filed at 04/08/17 0400  Gross per 24 hour  Intake              580 ml  Output             1050 ml  Net             -470 ml   Filed Weights   04/07/17 1108 04/08/17 0417  Weight: 155 lb (70.3 kg) 152 lb 5.4 oz (69.1 kg)    Telemetry    NSR, POD - Personally Reviewed  ECG    AV paced- Personally Reviewed  Physical Exam   GEN: No acute distress.   Neck: No JVD Cardiac: RRR, no murmurs, rubs, or gallops. Pacer site without hematoma Respiratory: Clear to auscultation bilaterally. GI: Soft, nontender, non-distended  MS: No edema; No deformity. Neuro:  Nonfocal  Psych: Normal affect   Labs    Chemistry  Recent Labs Lab 04/07/17 1120  NA 138  K 4.7  CL 104  CO2 24  GLUCOSE 103*  BUN 13  CREATININE 1.14  CALCIUM 9.3  PROT 6.9  ALBUMIN 4.2  AST 32  ALT 46  ALKPHOS 70  BILITOT 3.4*  GFRNONAA >60  GFRAA >60  ANIONGAP 10     Hematology  Recent Labs Lab 04/07/17 1120  WBC 15.0*  RBC 5.03  HGB 14.1  HCT 44.3  MCV 88.1  MCH 28.0  MCHC 31.8  RDW 13.9  PLT 187     Radiology    Dg Chest Portable 1 View  Result Date: 04/07/2017 CLINICAL DATA:  Heart block EXAM: PORTABLE CHEST 1 VIEW COMPARISON:  11/24/2006 FINDINGS: Mild cardiomegaly. Lungs are clear. No effusions or  edema. No acute bony abnormality. IMPRESSION: Cardiomegaly.  No active disease. Electronically Signed   By: Rolm Baptise M.D.   On: 04/07/2017 11:54   CXR 04/08/17-  Cardiac Studies    Patient Profile     71 y.o. male on no medications PTA with a history of CLL, admitted 04/07/17 with CHB. The pt was in a drug store checking his B/P when he noted his HR in the 30's. He admits to recent early fatigue with exertion. S/P St Jude pacemaker 04/07/17. He was also noted to be hypertensive and has an LDL of 147. He has a strong FMHx of CAD.   Assessment & Plan    CHB- S/P St Jude PTVDP 04/07/17  HTN- New diagnosis for him- suggest adding Amlodipine 5 mg  HLD- With strong FMHx would recommend statin Rx  CLL- followed by Dr. Alvy Bimler  FMhx CAD- Pt's brother died of an MI  Plan- Will discuss above with MD.  In the past the pt has shown reluctance to take medication or have medical screening done. Awaiting CXR, probably DC later today.   Signed, Kerin Ransom, PA-C  04/08/2017, 8:10 AM    Pt seen and examined instuctions given

## 2017-04-08 NOTE — Discharge Instructions (Signed)
Per Dr Caryl Comes

## 2017-04-10 ENCOUNTER — Encounter (HOSPITAL_COMMUNITY): Payer: Self-pay | Admitting: Internal Medicine

## 2017-04-10 MED FILL — Heparin Sodium (Porcine) 2 Unit/ML in Sodium Chloride 0.9%: INTRAMUSCULAR | Qty: 1000 | Status: AC

## 2017-04-11 ENCOUNTER — Telehealth: Payer: Self-pay | Admitting: Internal Medicine

## 2017-04-11 NOTE — Telephone Encounter (Signed)
Patient called again about his RTW note. Please call.

## 2017-04-11 NOTE — Telephone Encounter (Signed)
New Message     Pt needs a notes stating he can return to work and what his restrictions are .

## 2017-04-11 NOTE — Telephone Encounter (Signed)
Called, spoke with pt. Pt requested return to work letter for tomorrow. Forwarded to Dr. Lovena Le to advise.  Dr. Lovena Le stated pt may return to work 04/17/17.   Informed pt Dr. Tanna Furry recommendation. Informed letter will be available at our office for pick up.   Pt verbalized understanding.

## 2017-04-20 ENCOUNTER — Ambulatory Visit (INDEPENDENT_AMBULATORY_CARE_PROVIDER_SITE_OTHER): Payer: Commercial Managed Care - PPO | Admitting: *Deleted

## 2017-04-20 DIAGNOSIS — I442 Atrioventricular block, complete: Secondary | ICD-10-CM | POA: Diagnosis not present

## 2017-04-20 LAB — CUP PACEART INCLINIC DEVICE CHECK
Battery Voltage: 3.08 V
Brady Statistic RV Percent Paced: 74 %
Implantable Lead Implant Date: 20180504
Implantable Lead Location: 753859
Lead Channel Pacing Threshold Amplitude: 0.75 V
Lead Channel Pacing Threshold Amplitude: 0.75 V
Lead Channel Pacing Threshold Pulse Width: 0.5 ms
Lead Channel Sensing Intrinsic Amplitude: 12 mV
Lead Channel Sensing Intrinsic Amplitude: 5 mV
Lead Channel Setting Pacing Amplitude: 0.875
Lead Channel Setting Pacing Amplitude: 2.25 V
Lead Channel Setting Pacing Pulse Width: 0.5 ms
Lead Channel Setting Sensing Sensitivity: 4 mV
MDC IDC LEAD IMPLANT DT: 20180504
MDC IDC LEAD LOCATION: 753860
MDC IDC MSMT LEADCHNL RV PACING THRESHOLD PULSEWIDTH: 0.5 ms
MDC IDC PG IMPLANT DT: 20180504
MDC IDC SESS DTM: 20180517175703
MDC IDC STAT BRADY RA PERCENT PACED: 37 %
Pulse Gen Serial Number: 8900450

## 2017-04-20 NOTE — Progress Notes (Signed)
Wound check appointment. Steri-strips removed. Wound without redness or edema. Incision edges approximated, wound well healed. Normal device function. Thresholds, sensing, and impedances consistent with implant measurements. Device programmed at auto capture programmed on for extra safety margin until 3 month visit. Histogram distribution appropriate for patient and level of activity. 119 mode switches (<1% burden)-- available ECGs appear to be competitive atrial pacing, longest 1 minute 30 secs. No high ventricular rates noted. 1 PMT noted-- ECG appears AT, no VA conduction this session. Patient educated about wound care, arm mobility, lifting restrictions. ROV with GT 07/25/17.

## 2017-05-08 ENCOUNTER — Other Ambulatory Visit: Payer: Self-pay | Admitting: Hematology and Oncology

## 2017-05-09 ENCOUNTER — Telehealth: Payer: Self-pay | Admitting: Hematology and Oncology

## 2017-05-09 ENCOUNTER — Ambulatory Visit (HOSPITAL_BASED_OUTPATIENT_CLINIC_OR_DEPARTMENT_OTHER): Payer: Commercial Managed Care - PPO | Admitting: Hematology and Oncology

## 2017-05-09 ENCOUNTER — Encounter: Payer: Self-pay | Admitting: Hematology and Oncology

## 2017-05-09 ENCOUNTER — Other Ambulatory Visit (HOSPITAL_BASED_OUTPATIENT_CLINIC_OR_DEPARTMENT_OTHER): Payer: Commercial Managed Care - PPO

## 2017-05-09 DIAGNOSIS — Z85828 Personal history of other malignant neoplasm of skin: Secondary | ICD-10-CM | POA: Diagnosis not present

## 2017-05-09 DIAGNOSIS — I442 Atrioventricular block, complete: Secondary | ICD-10-CM | POA: Diagnosis not present

## 2017-05-09 DIAGNOSIS — C9110 Chronic lymphocytic leukemia of B-cell type not having achieved remission: Secondary | ICD-10-CM

## 2017-05-09 LAB — CBC WITH DIFFERENTIAL/PLATELET
BASO%: 0.2 % (ref 0.0–2.0)
BASOS ABS: 0 10*3/uL (ref 0.0–0.1)
EOS ABS: 0.3 10*3/uL (ref 0.0–0.5)
EOS%: 1.3 % (ref 0.0–7.0)
HCT: 42.2 % (ref 38.4–49.9)
HEMOGLOBIN: 13.3 g/dL (ref 13.0–17.1)
LYMPH%: 78.8 % — ABNORMAL HIGH (ref 14.0–49.0)
MCH: 28.5 pg (ref 27.2–33.4)
MCHC: 31.5 g/dL — ABNORMAL LOW (ref 32.0–36.0)
MCV: 90.6 fL (ref 79.3–98.0)
MONO#: 0.5 10*3/uL (ref 0.1–0.9)
MONO%: 2 % (ref 0.0–14.0)
NEUT%: 17.7 % — ABNORMAL LOW (ref 39.0–75.0)
NEUTROS ABS: 4.2 10*3/uL (ref 1.5–6.5)
PLATELETS: 176 10*3/uL (ref 140–400)
RBC: 4.66 10*6/uL (ref 4.20–5.82)
RDW: 13.2 % (ref 11.0–14.6)
WBC: 23.8 10*3/uL — AB (ref 4.0–10.3)
lymph#: 18.8 10*3/uL — ABNORMAL HIGH (ref 0.9–3.3)

## 2017-05-09 LAB — TECHNOLOGIST REVIEW

## 2017-05-09 NOTE — Telephone Encounter (Signed)
Scheduled appt per 05/09/2017 los - Gave patient AVS and calender.

## 2017-05-10 NOTE — Assessment & Plan Note (Signed)
He has no signs and symptoms of disease progression I reinforced the importance of preventive vaccination and age appropriate screening programs However, the patient declined. I will see him on an annual basis with history, physical examination and blood work  

## 2017-05-10 NOTE — Assessment & Plan Note (Signed)
He is at risk of skin cancer due to CLL I recommend skin protection and regular dermatology follow-up

## 2017-05-10 NOTE — Progress Notes (Signed)
Douglas [name] OFFICE PROGRESS NOTE  Patient Care Team: [name], MD as PCP - General (Family Medicine)  SUMMARY OF ONCOLOGIC HISTORY:  Douglas [name] was transferred to my care after his prior physician has left.  I reviewed the patient's records extensive and collaborated the history with the patient. 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 He has no symptoms and was observed. He denies new lymphadenopathy. He follows closely with dermatology. He never have surveillance colonoscopy or prostate exams nor vaccinations as he does not "believe" in them He does not want routine vaccination either The patient decided have a primary care doctor.  INTERVAL HISTORY: Please see below for problem oriented charting. He appears well. No new lymphadenopathy. Denies night sweats or skin changes. He has not seen his skin doctor for some time. He had recent diagnosis of heart block and had pacemaker implantation. He denies chest pain or shortness of breath. Denies recent infection  REVIEW OF SYSTEMS:   Constitutional: Denies fevers, chills or abnormal weight loss Eyes: Denies blurriness of vision Ears, nose, mouth, throat, and face: Denies mucositis or sore throat Respiratory: Denies cough, dyspnea or wheezes Cardiovascular: Denies palpitation, chest discomfort or lower extremity swelling Gastrointestinal:  Denies nausea, heartburn or change in bowel habits Skin: Denies abnormal skin rashes Lymphatics: Denies new lymphadenopathy or easy bruising Neurological:Denies numbness, tingling or new weaknesses Behavioral/Psych: Mood is stable, no new changes  All other systems were reviewed with the patient and are negative.  I have reviewed the past medical history, past surgical history, social history and family history with the patient and they are unchanged from previous note.  ALLERGIES:  is allergic to sulfa  antibiotics.  MEDICATIONS:  Current Outpatient Prescriptions  Medication Sig Dispense Refill  . acetaminophen (TYLENOL) 325 MG tablet Take 1-2 tablets (325-650 mg total) by mouth every 4 (four) hours as needed for mild pain. (Patient not taking: Reported on 05/09/2017)     No current facility-administered medications for this visit.     PHYSICAL EXAMINATION: ECOG PERFORMANCE STATUS: 0 - Asymptomatic  Vitals:   05/09/17 0846  BP: 139/66  Pulse: 86  Resp: 16  Temp: 98.6 F (37 C)   Filed Weights   05/09/17 0846  Weight: 151 lb 3.2 oz (68.6 kg)    GENERAL:alert, no distress and comfortable SKIN: skin color, texture, turgor are normal, no rashes or significant lesions EYES: normal, Conjunctiva are pink and non-injected, sclera clear OROPHARYNX:no exudate, no erythema and lips, buccal mucosa, and tongue normal  NECK: supple, thyroid normal size, non-tender, without nodularity LYMPH:  no palpable lymphadenopathy in the cervical, axillary or inguinal LUNGS: clear to auscultation and percussion with normal breathing effort HEART: regular rate & rhythm and no murmurs and no lower extremity edema ABDOMEN:abdomen soft, non-tender and normal bowel sounds Musculoskeletal:no cyanosis of digits and no clubbing  NEURO: alert & oriented x 3 with fluent speech, no focal motor/sensory deficits  LABORATORY DATA:  I have reviewed the data as listed    Component Value Date/Time   NA 138 04/07/2017 1120   NA 141 05/12/2015 0917   K 4.7 04/07/2017 1120   K 5.1 05/12/2015 0917   CL 104 04/07/2017 1120   CL 105 11/06/2012 0941   CO2 24 04/07/2017 1120   CO2 30 (H) 05/12/2015 0917   GLUCOSE 103 (H) 04/07/2017 1120   GLUCOSE 113 05/12/2015 0917   GLUCOSE 105 (H) 11/06/2012 0941  BUN 13 04/07/2017 1120   BUN 17.1 05/12/2015 0917   CREATININE 1.14 04/07/2017 1120   CREATININE 0.90 05/30/2016 1539   CREATININE 1.0 05/12/2015 0917   CALCIUM 9.3 04/07/2017 1120   CALCIUM 9.5 05/12/2015 0917    PROT 6.9 04/07/2017 1120   PROT 6.5 05/12/2015 0917   ALBUMIN 4.2 04/07/2017 1120   ALBUMIN 3.7 05/12/2015 0917   AST 32 04/07/2017 1120   AST 18 05/12/2015 0917   ALT 46 04/07/2017 1120   ALT 12 05/12/2015 0917   ALKPHOS 70 04/07/2017 1120   ALKPHOS 65 05/12/2015 0917   BILITOT 3.4 (H) 04/07/2017 1120   BILITOT 2.12 (H) 05/12/2015 0917   GFRNONAA >60 04/07/2017 1120   GFRAA >60 04/07/2017 1120    No results found for: SPEP, UPEP  Lab Results  Component Value Date   WBC 23.8 (H) 05/09/2017   NEUTROABS 4.2 05/09/2017   HGB 13.3 05/09/2017   HCT 42.2 05/09/2017   MCV 90.6 05/09/2017   PLT 176 05/09/2017      Chemistry      Component Value Date/Time   NA 138 04/07/2017 1120   NA 141 05/12/2015 0917   K 4.7 04/07/2017 1120   K 5.1 05/12/2015 0917   CL 104 04/07/2017 1120   CL 105 11/06/2012 0941   CO2 24 04/07/2017 1120   CO2 30 (H) 05/12/2015 0917   BUN 13 04/07/2017 1120   BUN 17.1 05/12/2015 0917   CREATININE 1.14 04/07/2017 1120   CREATININE 0.90 05/30/2016 1539   CREATININE 1.0 05/12/2015 0917      Component Value Date/Time   CALCIUM 9.3 04/07/2017 1120   CALCIUM 9.5 05/12/2015 0917   ALKPHOS 70 04/07/2017 1120   ALKPHOS 65 05/12/2015 0917   AST 32 04/07/2017 1120   AST 18 05/12/2015 0917   ALT 46 04/07/2017 1120   ALT 12 05/12/2015 0917   BILITOT 3.4 (H) 04/07/2017 1120   BILITOT 2.12 (H) 05/12/2015 0917       ASSESSMENT & PLAN:  CLL (chronic lymphocytic leukemia) ([place]) He has no signs and symptoms of disease progression I reinforced the importance of preventive vaccination and age appropriate screening programs However, the patient declined. I will see him on an annual basis with history, physical examination and blood work  History of skin cancer He is at risk of skin cancer due to CLL I recommend skin protection and regular dermatology follow-up    Complete heart block ([place]) I would defer to cardiologist for management.  He is  currently not taking any medications   No orders of the defined types were placed in this encounter.  All questions were answered. The patient knows to call the clinic with any problems, questions or concerns. No barriers to learning was detected. I spent 15 minutes counseling the patient face to face. The total time spent in the appointment was 20 minutes and more than 50% was on counseling and review of test results     Heath Lark, MD 05/10/2017 12:44 PM

## 2017-05-10 NOTE — Assessment & Plan Note (Signed)
I would defer to cardiologist for management.  He is currently not taking any medications

## 2017-06-23 ENCOUNTER — Other Ambulatory Visit: Payer: Self-pay | Admitting: Internal Medicine

## 2017-07-13 ENCOUNTER — Encounter: Payer: Commercial Managed Care - PPO | Admitting: Internal Medicine

## 2017-07-25 ENCOUNTER — Encounter: Payer: Self-pay | Admitting: Internal Medicine

## 2017-07-25 ENCOUNTER — Ambulatory Visit (INDEPENDENT_AMBULATORY_CARE_PROVIDER_SITE_OTHER): Payer: Commercial Managed Care - PPO | Admitting: Internal Medicine

## 2017-07-25 VITALS — BP 110/70 | HR 70 | Ht 67.0 in | Wt 155.0 lb

## 2017-07-25 DIAGNOSIS — I442 Atrioventricular block, complete: Secondary | ICD-10-CM | POA: Diagnosis not present

## 2017-07-25 LAB — CUP PACEART INCLINIC DEVICE CHECK
Battery Remaining Longevity: 122 mo
Battery Voltage: 3.02 V
Brady Statistic RV Percent Paced: 52 %
Date Time Interrogation Session: 20180821112423
Implantable Lead Implant Date: 20180504
Implantable Lead Implant Date: 20180504
Implantable Lead Location: 753859
Lead Channel Impedance Value: 537.5 Ohm
Lead Channel Impedance Value: 550 Ohm
Lead Channel Pacing Threshold Pulse Width: 0.5 ms
Lead Channel Pacing Threshold Pulse Width: 0.5 ms
Lead Channel Sensing Intrinsic Amplitude: 12 mV
Lead Channel Setting Pacing Amplitude: 0.75 V
Lead Channel Setting Pacing Amplitude: 1.625
Lead Channel Setting Sensing Sensitivity: 4 mV
MDC IDC LEAD LOCATION: 753860
MDC IDC MSMT LEADCHNL RA PACING THRESHOLD AMPLITUDE: 0.75 V
MDC IDC MSMT LEADCHNL RA PACING THRESHOLD AMPLITUDE: 0.75 V
MDC IDC MSMT LEADCHNL RA PACING THRESHOLD PULSEWIDTH: 0.5 ms
MDC IDC MSMT LEADCHNL RA SENSING INTR AMPL: 5 mV
MDC IDC MSMT LEADCHNL RV PACING THRESHOLD AMPLITUDE: 0.75 V
MDC IDC MSMT LEADCHNL RV PACING THRESHOLD AMPLITUDE: 0.75 V
MDC IDC MSMT LEADCHNL RV PACING THRESHOLD PULSEWIDTH: 0.5 ms
MDC IDC PG IMPLANT DT: 20180504
MDC IDC SET LEADCHNL RV PACING PULSEWIDTH: 0.5 ms
MDC IDC STAT BRADY RA PERCENT PACED: 44 %
Pulse Gen Model: 2272
Pulse Gen Serial Number: 8900450

## 2017-07-25 NOTE — Patient Instructions (Addendum)
Medication Instructions:  Your physician recommends that you continue on your current medications as directed. Please refer to the Current Medication list given to you today.  Labwork: None ordered.  Testing/Procedures: None ordered.  Follow-Up: Your physician wants you to follow-up in:  9 months with Dr. Lovena Le.   You will receive a reminder letter in the mail two months in advance. If you don't receive a letter, please call our office to schedule the follow-up appointment.  Remote monitoring is used to monitor your Pacemaker from home. This monitoring reduces the number of office visits required to check your device to one time per year. It allows [place] to keep an eye on the functioning of your device to ensure it is working properly. You are scheduled for a device check from home on 10/24/2017. You may send your transmission at any time that day. If you have a wireless device, the transmission will be sent automatically. After your physician reviews your transmission, you will receive a postcard with your next transmission date.    Any Other Special Instructions Will Be Listed Below (If Applicable).     If you need a refill on your cardiac medications before your next appointment, please call your pharmacy.

## 2017-07-25 NOTE — Progress Notes (Signed)
HPI Douglas [name] returns today for followup. He is a pleasant 71 yo man who is still working with a h/o transient CHB, s/p PPM insertion. In the interim, he has done well although he feels at times like his heart is beating too fast. No chest pain or sob. No trouble healing from his incision. Overall he feels well. Allergies  Allergen Reactions  . Sulfa Antibiotics Nausea Only and Rash     Current Outpatient Prescriptions  Medication Sig Dispense Refill  . acetaminophen (TYLENOL) 325 MG tablet Take 1-2 tablets (325-650 mg total) by mouth every 4 (four) hours as needed for mild pain.     No current facility-administered medications for this visit.      Past Medical History:  Diagnosis Date  . Basal cell carcinoma of right temple region   . Hyperbilirubinemia   . Leukemia ([place])   . Leukemia, chronic lymphocytic ([place])   . Onychomycosis   . 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   Past Surgical History:  Procedure Laterality Date  . Basal Cell Carcinoma Excison  2012  . PACEMAKER IMPLANT N/A 04/07/2017   Procedure: Pacemaker Implant;  Surgeon: Evans Lance, MD;  Location: [facility];  Service: Cardiovascular;  Laterality: N/A;  . SQUAMOUS CELL CARCINOMA EXCISION  2006     Family History  Problem Relation Age of Onset  . Alzheimer's disease Mother   . Hypertension Father   . Diabetes Father   . Heart attack Father   . Heart attack Brother   . Cancer Brother        CLL     Social History   Social History  . Marital status: Married    Spouse name: N/A  . Number of children: N/A  . Years of education: N/A   Occupational History  . Not on file.   Social History Main Topics  . Smoking status: Never Smoker  . Smokeless tobacco: Never Used  . Alcohol use No  . Drug use: No  . Sexual activity: Not on file     Comment: married, he is retired   Other Topics Concern  .  Not on file   Social History Narrative  . No narrative on file     BP 110/70   Pulse 70   Ht 5\' 7"  (1.702 m)   Wt 155 lb (70.3 kg)   BMI 24.28 kg/m   Physical Exam:  Well appearing 71 year old man, NAD HEENT: Unremarkable Neck:  6 cm JVD, no thyromegally, 2 cm mobile lesion under the skin consistent with lipoma Lymphatics:  No adenopathy Back:  No CVA tenderness Lungs:  Clear, with no wheezes, rales, or rhonchi. Well-healed pacemaker incision HEART:  Regular rate rhythm, no murmurs, no rubs, no clicks Abd:  soft, positive bowel sounds, no organomegally, no rebound, no guarding Ext:  2 plus pulses, no edema, no cyanosis, no clubbing Skin:  No rashes no nodules Neuro:  CN II through XII intact, motor grossly intact  EKG - sinus rhythm with atrial pacing and left bundle branch block  DEVICE  Normal device function.  See PaceArt for details. Rate response turned off  Assess/Plan: 1. Complete heart block, transient - he is status post permanent pacemaker insertion with his [employer] dual-chamber device working normally except that the rate response feature was making his heart beat a little too fast  and has been discontinued. 2. 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 3. Syncope - he has had no recurrent episodes since his pacemaker was placed. He will undergo watchful waiting.  Douglas [name], M.D.

## 2017-10-24 ENCOUNTER — Ambulatory Visit (INDEPENDENT_AMBULATORY_CARE_PROVIDER_SITE_OTHER): Payer: Commercial Managed Care - PPO | Admitting: *Deleted

## 2017-10-24 DIAGNOSIS — I442 Atrioventricular block, complete: Secondary | ICD-10-CM

## 2017-10-25 LAB — CUP PACEART REMOTE DEVICE CHECK
Battery Remaining Percentage: 95.5 %
Brady Statistic AP VP Percent: 11 %
Brady Statistic AS VP Percent: 12 %
Brady Statistic RV Percent Paced: 23 %
Implantable Lead Implant Date: 20180504
Implantable Lead Location: 753859
Implantable Pulse Generator Implant Date: 20180504
Lead Channel Impedance Value: 550 Ohm
Lead Channel Pacing Threshold Amplitude: 0.5 V
Lead Channel Pacing Threshold Amplitude: 1.5 V
Lead Channel Pacing Threshold Pulse Width: 0.5 ms
Lead Channel Sensing Intrinsic Amplitude: 5 mV
Lead Channel Setting Pacing Amplitude: 0.75 V
Lead Channel Setting Pacing Pulse Width: 0.5 ms
Lead Channel Setting Sensing Sensitivity: 4 mV
MDC IDC LEAD IMPLANT DT: 20180504
MDC IDC LEAD LOCATION: 753860
MDC IDC MSMT BATTERY REMAINING LONGEVITY: 128 mo
MDC IDC MSMT BATTERY VOLTAGE: 3.02 V
MDC IDC MSMT LEADCHNL RA IMPEDANCE VALUE: 560 Ohm
MDC IDC MSMT LEADCHNL RA PACING THRESHOLD PULSEWIDTH: 0.5 ms
MDC IDC MSMT LEADCHNL RV SENSING INTR AMPL: 12 mV
MDC IDC PG SERIAL: 8900450
MDC IDC SESS DTM: 20181117050736
MDC IDC SET LEADCHNL RA PACING AMPLITUDE: 2.5 V
MDC IDC STAT BRADY AP VS PERCENT: 15 %
MDC IDC STAT BRADY AS VS PERCENT: 62 %
MDC IDC STAT BRADY RA PERCENT PACED: 25 %

## 2017-10-25 NOTE — Progress Notes (Signed)
Remote pacemaker transmission.   

## 2017-11-02 ENCOUNTER — Encounter: Payer: Self-pay | Admitting: Cardiology

## 2018-01-23 ENCOUNTER — Telehealth: Payer: Self-pay | Admitting: Cardiology

## 2018-01-23 ENCOUNTER — Encounter: Payer: Managed Care, Other (non HMO) | Admitting: *Deleted

## 2018-01-23 NOTE — Telephone Encounter (Signed)
LMOVM reminding pt to send remote transmission.   

## 2018-01-25 ENCOUNTER — Encounter: Payer: Self-pay | Admitting: Cardiology

## 2018-02-06 ENCOUNTER — Ambulatory Visit (INDEPENDENT_AMBULATORY_CARE_PROVIDER_SITE_OTHER): Payer: Managed Care, Other (non HMO) | Admitting: *Deleted

## 2018-02-06 DIAGNOSIS — I442 Atrioventricular block, complete: Secondary | ICD-10-CM

## 2018-02-07 ENCOUNTER — Encounter: Payer: Self-pay | Admitting: Cardiology

## 2018-02-07 NOTE — Progress Notes (Signed)
Remote pacemaker transmission.   

## 2018-02-11 LAB — CUP PACEART REMOTE DEVICE CHECK
Battery Remaining Longevity: 125 mo
Battery Voltage: 3.02 V
Brady Statistic AS VP Percent: 26 %
Brady Statistic RA Percent Paced: 23 %
Date Time Interrogation Session: 20190223100028
Implantable Lead Implant Date: 20180504
Implantable Lead Location: 753860
Implantable Pulse Generator Implant Date: 20180504
Lead Channel Impedance Value: 530 Ohm
Lead Channel Impedance Value: 540 Ohm
Lead Channel Pacing Threshold Pulse Width: 0.5 ms
Lead Channel Sensing Intrinsic Amplitude: 12 mV
Lead Channel Setting Pacing Amplitude: 1.625
MDC IDC LEAD IMPLANT DT: 20180504
MDC IDC LEAD LOCATION: 753859
MDC IDC MSMT BATTERY REMAINING PERCENTAGE: 95.5 %
MDC IDC MSMT LEADCHNL RA PACING THRESHOLD AMPLITUDE: 0.625 V
MDC IDC MSMT LEADCHNL RA SENSING INTR AMPL: 5 mV
MDC IDC MSMT LEADCHNL RV PACING THRESHOLD AMPLITUDE: 0.625 V
MDC IDC MSMT LEADCHNL RV PACING THRESHOLD PULSEWIDTH: 0.5 ms
MDC IDC SET LEADCHNL RV PACING AMPLITUDE: 0.875
MDC IDC SET LEADCHNL RV PACING PULSEWIDTH: 0.5 ms
MDC IDC SET LEADCHNL RV SENSING SENSITIVITY: 4 mV
MDC IDC STAT BRADY AP VP PERCENT: 11 %
MDC IDC STAT BRADY AP VS PERCENT: 12 %
MDC IDC STAT BRADY AS VS PERCENT: 51 %
MDC IDC STAT BRADY RV PERCENT PACED: 37 %
Pulse Gen Model: 2272
Pulse Gen Serial Number: 8900450

## 2018-03-14 ENCOUNTER — Ambulatory Visit (INDEPENDENT_AMBULATORY_CARE_PROVIDER_SITE_OTHER): Payer: Managed Care, Other (non HMO)

## 2018-03-14 ENCOUNTER — Ambulatory Visit (INDEPENDENT_AMBULATORY_CARE_PROVIDER_SITE_OTHER): Payer: Managed Care, Other (non HMO) | Admitting: Physician Assistant

## 2018-03-14 ENCOUNTER — Encounter: Payer: Self-pay | Admitting: Physician Assistant

## 2018-03-14 VITALS — BP 140/70 | HR 90 | Temp 98.1°F | Resp 16 | Ht 67.0 in | Wt 158.0 lb

## 2018-03-14 DIAGNOSIS — M25572 Pain in left ankle and joints of left foot: Secondary | ICD-10-CM

## 2018-03-14 NOTE — Progress Notes (Signed)
    03/14/2018 10:42 AM   DOB: [birth year] / MRN: [id number]  SUBJECTIVE:  Douglas [name] is a 72 y.o. male presenting for left-sided ankle pain after rolling injury.  Denies pain at rest and pain is worse with weightbearing.  The pain is generalized.  He is using crutches and tells me that he did not want to walk on the foot in the event that his foot was broken.  Start tried anything for pain.  He is allergic to sulfa antibiotics.   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    He  reports that he has never smoked. He has never used smokeless tobacco. He reports that he does not drink alcohol or use drugs. He  has no sexual activity history on file. The patient  has a past surgical history that includes Squamous cell carcinoma excision (2006); Basal Cell Carcinoma Excison (2012); and PACEMAKER IMPLANT (N/A, 04/07/2017).  His family history includes Alzheimer's disease in his mother; Cancer in his brother; Diabetes in his father; Heart attack in his brother and father; Hypertension in his father.  Review of Systems  Musculoskeletal: Positive for falls and joint pain. Negative for back pain, myalgias and neck pain.    The problem list and medications were reviewed and updated by myself where necessary and exist elsewhere in the encounter.   OBJECTIVE:  BP 140/70   Pulse 90   Temp 98.1 F (36.7 C) (Oral)   Resp 16   Ht 5\' 7"  (1.702 m)   Wt 158 lb (71.7 kg)   SpO2 98%   BMI 24.75 kg/m   Physical Exam  Constitutional: He appears well-developed. He is active and cooperative.  Non-toxic appearance.  Cardiovascular: Normal rate.  Pulmonary/Chest: Effort normal. No tachypnea.  Musculoskeletal: He exhibits tenderness (About the ATFL ligament.  Negative for malleoli or tenderness proximal fibular tenderness, foot  tenderness.  Negative open drawer.).  Neurological: He is alert.  Skin: Skin is warm and dry. He is not diaphoretic. No pallor.  Vitals reviewed.   No results found for this or any previous visit (from the past 72 hour(s)).  No results found.  ASSESSMENT AND PLAN:  [name] was seen today for ankle injury.  Diagnoses and all orders for this visit:  Left ankle pain, unspecified chronicity: X-rays appear negative aside from some mild lateral ankle OA.  Advised Tylenol and we have placed him in a Cam Walker.  Advised to wear this for about a week to 2 weeks and work his way out as he sees fit. -     DG Ankle Complete Left; Future    The patient is advised to call or return to clinic if he does not see an improvement in symptoms, or to seek the care of the closest emergency department if he worsens with the above plan.   Philis Fendt, MHS, PA-C [facility] 03/14/2018 10:42 AM

## 2018-03-14 NOTE — Patient Instructions (Addendum)
  Wear the cam walker for 1-2 weeks and work your way out of the boot as you see fit.  Come back if he needs anything further.  For pain I would recommend continuing ice along with Tylenol 1000 mg every 8 hours as needed.   IF you received an x-ray today, you will receive an invoice from [facility]. Please contact [facility] at [phone] with questions or concerns regarding your invoice.   IF you received labwork today, you will receive an invoice from [facility]. Please contact [facility] at [phone] with questions or concerns regarding your invoice.   Our billing staff will not be able to assist you with questions regarding bills from these companies.  You will be contacted with the lab results as soon as they are available. The fastest way to get your results is to activate your My Chart account. Instructions are located on the last page of this paperwork. If you have not heard from [place] regarding the results in 2 weeks, please contact this office.

## 2018-05-08 ENCOUNTER — Ambulatory Visit (INDEPENDENT_AMBULATORY_CARE_PROVIDER_SITE_OTHER): Payer: Managed Care, Other (non HMO) | Admitting: *Deleted

## 2018-05-08 ENCOUNTER — Telehealth: Payer: Self-pay | Admitting: Cardiology

## 2018-05-08 DIAGNOSIS — I442 Atrioventricular block, complete: Secondary | ICD-10-CM | POA: Diagnosis not present

## 2018-05-08 NOTE — Progress Notes (Signed)
Remote pacemaker transmission.   

## 2018-05-08 NOTE — Telephone Encounter (Signed)
Spoke with pt and reminded pt of remote transmission that is due today. Pt verbalized understanding.   

## 2018-05-10 ENCOUNTER — Other Ambulatory Visit: Payer: Self-pay | Admitting: Hematology and Oncology

## 2018-05-10 ENCOUNTER — Inpatient Hospital Stay: Payer: Managed Care, Other (non HMO) | Attending: Hematology and Oncology | Admitting: Hematology and Oncology

## 2018-05-10 ENCOUNTER — Encounter: Payer: Self-pay | Admitting: Hematology and Oncology

## 2018-05-10 ENCOUNTER — Telehealth: Payer: Self-pay

## 2018-05-10 ENCOUNTER — Inpatient Hospital Stay: Payer: Managed Care, Other (non HMO)

## 2018-05-10 ENCOUNTER — Telehealth: Payer: Self-pay | Admitting: Hematology and Oncology

## 2018-05-10 ENCOUNTER — Ambulatory Visit (INDEPENDENT_AMBULATORY_CARE_PROVIDER_SITE_OTHER): Payer: Managed Care, Other (non HMO) | Admitting: Internal Medicine

## 2018-05-10 ENCOUNTER — Encounter: Payer: Self-pay | Admitting: Internal Medicine

## 2018-05-10 VITALS — BP 124/64 | HR 64 | Ht 67.0 in | Wt 161.0 lb

## 2018-05-10 DIAGNOSIS — D367 Benign neoplasm of other specified sites: Secondary | ICD-10-CM | POA: Diagnosis not present

## 2018-05-10 DIAGNOSIS — Z85828 Personal history of other malignant neoplasm of skin: Secondary | ICD-10-CM | POA: Diagnosis not present

## 2018-05-10 DIAGNOSIS — C9110 Chronic lymphocytic leukemia of B-cell type not having achieved remission: Secondary | ICD-10-CM

## 2018-05-10 DIAGNOSIS — Z950 Presence of cardiac pacemaker: Secondary | ICD-10-CM | POA: Diagnosis not present

## 2018-05-10 DIAGNOSIS — I442 Atrioventricular block, complete: Secondary | ICD-10-CM | POA: Diagnosis not present

## 2018-05-10 LAB — CBC WITH DIFFERENTIAL/PLATELET
BASOS ABS: 0.2 10*3/uL — AB (ref 0.0–0.1)
BASOS PCT: 1 %
EOS ABS: 0.3 10*3/uL (ref 0.0–0.5)
EOS PCT: 1 %
HCT: 42.1 % (ref 38.4–49.9)
Hemoglobin: 13.7 g/dL (ref 13.0–17.1)
LYMPHS PCT: 77 %
Lymphs Abs: 18.4 10*3/uL — ABNORMAL HIGH (ref 0.9–3.3)
MCH: 28.9 pg (ref 27.2–33.4)
MCHC: 32.5 g/dL (ref 32.0–36.0)
MCV: 88.7 fL (ref 79.3–98.0)
Monocytes Absolute: 0.4 10*3/uL (ref 0.1–0.9)
Monocytes Relative: 2 %
Neutro Abs: 4.5 10*3/uL (ref 1.5–6.5)
Neutrophils Relative %: 19 %
PLATELETS: 229 10*3/uL (ref 140–400)
RBC: 4.75 MIL/uL (ref 4.20–5.82)
RDW: 13.5 % (ref 11.0–14.6)
WBC: 23.8 10*3/uL — ABNORMAL HIGH (ref 4.0–10.3)

## 2018-05-10 LAB — CUP PACEART INCLINIC DEVICE CHECK
Battery Remaining Longevity: 123 mo
Brady Statistic RV Percent Paced: 54 %
Implantable Lead Implant Date: 20180504
Implantable Lead Location: 753859
Implantable Pulse Generator Implant Date: 20180504
Lead Channel Impedance Value: 525 Ohm
Lead Channel Impedance Value: 550 Ohm
Lead Channel Pacing Threshold Amplitude: 0.5 V
Lead Channel Pacing Threshold Amplitude: 0.5 V
Lead Channel Pacing Threshold Pulse Width: 0.5 ms
Lead Channel Pacing Threshold Pulse Width: 0.5 ms
Lead Channel Pacing Threshold Pulse Width: 0.5 ms
Lead Channel Sensing Intrinsic Amplitude: 5 mV
Lead Channel Setting Pacing Amplitude: 0.75 V
Lead Channel Setting Sensing Sensitivity: 4 mV
MDC IDC LEAD IMPLANT DT: 20180504
MDC IDC LEAD LOCATION: 753860
MDC IDC MSMT BATTERY VOLTAGE: 3.02 V
MDC IDC MSMT LEADCHNL RA PACING THRESHOLD AMPLITUDE: 0.5 V
MDC IDC MSMT LEADCHNL RA PACING THRESHOLD PULSEWIDTH: 0.5 ms
MDC IDC MSMT LEADCHNL RV PACING THRESHOLD AMPLITUDE: 0.5 V
MDC IDC MSMT LEADCHNL RV SENSING INTR AMPL: 12 mV
MDC IDC PG SERIAL: 8900450
MDC IDC SESS DTM: 20190606163014
MDC IDC SET LEADCHNL RA PACING AMPLITUDE: 1.5 V
MDC IDC SET LEADCHNL RV PACING PULSEWIDTH: 0.5 ms
MDC IDC STAT BRADY RA PERCENT PACED: 23 %

## 2018-05-10 NOTE — Telephone Encounter (Signed)
Gave patient AVs and calendar of upcoming June 2020 appointments.

## 2018-05-10 NOTE — Telephone Encounter (Signed)
Called referral to office of Dr. Lucia Gaskins. They will call patient and schedule appt. Faxed info over to office [phone].

## 2018-05-10 NOTE — Patient Instructions (Signed)

## 2018-05-10 NOTE — Assessment & Plan Note (Signed)
He is at risk of skin cancer due to CLL I recommend skin protection and regular dermatology follow-up The patient appears tanned and has been working out in the yard I reinforced the importance of avoidance of excessive sun exposure

## 2018-05-10 NOTE — Progress Notes (Signed)
[facility] OFFICE PROGRESS NOTE  Patient Care Team: [name], MD as PCP - General (Family Medicine)  ASSESSMENT & PLAN:  CLL (chronic lymphocytic leukemia) [facility]) He has no signs and symptoms of disease progression I reinforced the importance of preventive vaccination and age appropriate screening programs However, the patient declined. I will see him on an annual basis with history, physical examination and blood work   Dermoid cyst of neck Examination revealed a large cyst which resembled either a sebaceous cyst or lipoma and not a lymph node We discussed referral to either ENT or general surgery Due to the location in the neck, I plan to refer him to see ENT surgeon for management and he agreed to proceed  History of skin cancer He is at risk of skin cancer due to CLL I recommend skin protection and regular dermatology follow-up The patient appears tanned and has been working out in the yard I reinforced the importance of avoidance of excessive sun exposure     Orders Placed This Encounter  Procedures  . Ambulatory referral to ENT    Referral Priority:   Routine    Referral Type:   Consultation    Referral Reason:   Specialty Services Required    Referred to Provider:   Rozetta Nunnery, MD    Requested Specialty:   Otolaryngology    Number of Visits Requested:   1    INTERVAL HISTORY: Please see below for problem oriented charting. He returns for further follow-up He is bothered by the large cyst in his neck It is not causing any pain or restriction of neck movement He denies any other lumps elsewhere No new lymphadenopathy His appetite is stable, no recent weight change Denies abnormal night sweats He had no recent cardiac issues He denies recent infection He has declined vaccination program  SUMMARY OF ONCOLOGIC HISTORY:  Douglas [name] was transferred to my care after his prior physician has left.  I reviewed the patient's records  extensive and collaborated the history with the patient. 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 He has no symptoms and was observed. He denies new lymphadenopathy. He follows closely with dermatology. He never have surveillance colonoscopy or prostate exams nor vaccinations as he does not "believe" in them He does not want routine vaccination either  REVIEW OF SYSTEMS:   Constitutional: Denies fevers, chills or abnormal weight loss Eyes: Denies blurriness of vision Ears, nose, mouth, throat, and face: Denies mucositis or sore throat Respiratory: Denies cough, dyspnea or wheezes Cardiovascular: Denies palpitation, chest discomfort or lower extremity swelling Gastrointestinal:  Denies nausea, heartburn or change in bowel habits Skin: Denies abnormal skin rashes Lymphatics: Denies new lymphadenopathy or easy bruising Neurological:Denies numbness, tingling or new weaknesses Behavioral/Psych: Mood is stable, no new changes  All other systems were reviewed with the patient and are negative.  I have reviewed the past medical history, past surgical history, social history and family history with the patient and they are unchanged from previous note.  ALLERGIES:  is allergic to sulfa antibiotics.  MEDICATIONS:  Current Outpatient Medications  Medication Sig Dispense Refill  . acetaminophen (TYLENOL) 325 MG tablet Take 1-2 tablets (325-650 mg total) by mouth every 4 (four) hours as needed for mild pain. (Patient not taking: Reported on 03/14/2018)     No current facility-administered medications for this visit.     PHYSICAL EXAMINATION: ECOG PERFORMANCE STATUS: 0 -  Asymptomatic  Vitals:   05/10/18 0821  BP: 125/65  Pulse: 60  Resp: 17  Temp: 98 F (36.7 C)  SpO2: 99%   Filed Weights   05/10/18 0821  Weight: 161 lb 11.2 oz (73.3 kg)    GENERAL:alert, no distress and comfortable SKIN: He appears tanned. EYES: normal,  Conjunctiva are pink and non-injected, sclera clear OROPHARYNX:no exudate, no erythema and lips, buccal mucosa, and tongue normal  NECK: There is a large cyst at the back of his neck LYMPH:  no palpable lymphadenopathy in the cervical, axillary or inguinal LUNGS: clear to auscultation and percussion with normal breathing effort HEART: regular rate & rhythm and no murmurs and no lower extremity edema ABDOMEN:abdomen soft, non-tender and normal bowel sounds Musculoskeletal:no cyanosis of digits and no clubbing  NEURO: alert & oriented x 3 with fluent speech, no focal motor/sensory deficits  LABORATORY DATA:  I have reviewed the data as listed    Component Value Date/Time   NA 138 04/07/2017 1120   NA 141 05/12/2015 0917   K 4.7 04/07/2017 1120   K 5.1 05/12/2015 0917   CL 104 04/07/2017 1120   CL 105 11/06/2012 0941   CO2 24 04/07/2017 1120   CO2 30 (H) 05/12/2015 0917   GLUCOSE 103 (H) 04/07/2017 1120   GLUCOSE 113 05/12/2015 0917   GLUCOSE 105 (H) 11/06/2012 0941   BUN 13 04/07/2017 1120   BUN 17.1 05/12/2015 0917   CREATININE 1.14 04/07/2017 1120   CREATININE 0.90 05/30/2016 1539   CREATININE 1.0 05/12/2015 0917   CALCIUM 9.3 04/07/2017 1120   CALCIUM 9.5 05/12/2015 0917   PROT 6.9 04/07/2017 1120   PROT 6.5 05/12/2015 0917   ALBUMIN 4.2 04/07/2017 1120   ALBUMIN 3.7 05/12/2015 0917   AST 32 04/07/2017 1120   AST 18 05/12/2015 0917   ALT 46 04/07/2017 1120   ALT 12 05/12/2015 0917   ALKPHOS 70 04/07/2017 1120   ALKPHOS 65 05/12/2015 0917   BILITOT 3.4 (H) 04/07/2017 1120   BILITOT 2.12 (H) 05/12/2015 0917   GFRNONAA >60 04/07/2017 1120   GFRAA >60 04/07/2017 1120    No results found for: SPEP, UPEP  Lab Results  Component Value Date   WBC 23.8 (H) 05/10/2018   NEUTROABS 4.5 05/10/2018   HGB 13.7 05/10/2018   HCT 42.1 05/10/2018   MCV 88.7 05/10/2018   PLT 229 05/10/2018      Chemistry      Component Value Date/Time   NA 138 04/07/2017 1120   NA 141  05/12/2015 0917   K 4.7 04/07/2017 1120   K 5.1 05/12/2015 0917   CL 104 04/07/2017 1120   CL 105 11/06/2012 0941   CO2 24 04/07/2017 1120   CO2 30 (H) 05/12/2015 0917   BUN 13 04/07/2017 1120   BUN 17.1 05/12/2015 0917   CREATININE 1.14 04/07/2017 1120   CREATININE 0.90 05/30/2016 1539   CREATININE 1.0 05/12/2015 0917      Component Value Date/Time   CALCIUM 9.3 04/07/2017 1120   CALCIUM 9.5 05/12/2015 0917   ALKPHOS 70 04/07/2017 1120   ALKPHOS 65 05/12/2015 0917   AST 32 04/07/2017 1120   AST 18 05/12/2015 0917   ALT 46 04/07/2017 1120   ALT 12 05/12/2015 0917   BILITOT 3.4 (H) 04/07/2017 1120   BILITOT 2.12 (H) 05/12/2015 0917       All questions were answered. The patient knows to call the clinic with any problems, questions or concerns. No barriers to  learning was detected.  I spent 15 minutes counseling the patient face to face. The total time spent in the appointment was 20 minutes and more than 50% was on counseling and review of test results  Douglas Lark, MD 05/10/2018 9:55 AM

## 2018-05-10 NOTE — Assessment & Plan Note (Addendum)
He has no signs and symptoms of disease progression I reinforced the importance of preventive vaccination and age appropriate screening programs However, the patient declined. I will see him on an annual basis with history, physical examination and blood work

## 2018-05-10 NOTE — Progress Notes (Signed)
HPI [name] returns today for ongoing followup of CHB, s/p PPM insertion. In the interim, he has done well except for a 3-4 cm nodule on his neck which has become quite noticeable and for which he is pending excision. He denies chest pain or sob. No syncope and very minimal palpitations.  Allergies  Allergen Reactions  . Sulfa Antibiotics Nausea Only and Rash     Current Outpatient Medications  Medication Sig Dispense Refill  . acetaminophen (TYLENOL) 325 MG tablet Take 1-2 tablets (325-650 mg total) by mouth every 4 (four) hours as needed for mild pain. (Patient not taking: Reported on 03/14/2018)     No current facility-administered medications for this visit.      Past Medical History:  Diagnosis Date  . Basal cell carcinoma of right temple region   . Hyperbilirubinemia   . Leukemia ([place])   . 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 Onychomycosis   . 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   Past Surgical History:  Procedure Laterality Date  . Basal Cell Carcinoma Excison  2012  . PACEMAKER IMPLANT N/A 04/07/2017   Procedure: Pacemaker Implant;  Surgeon: Evans Lance, MD;  Location: [facility];  Service: Cardiovascular;  Laterality: N/A;  . SQUAMOUS CELL CARCINOMA EXCISION  2006     Family History  Problem Relation Age of Onset  . Alzheimer's disease Mother   . Hypertension Father   . Diabetes Father   . Heart attack Father   . Heart attack Brother   . Cancer Brother        CLL     Social History   Socioeconomic History  . Marital status: Married    Spouse name: Not on file  . Number of children: Not on file  . Years of education: Not on file  . Highest education level: Not on file  Occupational History  . Not on file  Social Needs  . Financial resource strain: Not on file  . Food insecurity:    Worry: Not on file    Inability: Not on file  .  Transportation needs:    Medical: Not on file    Non-medical: Not on file  Tobacco Use  . Smoking status: Never Smoker  . Smokeless tobacco: Never Used  Substance and Sexual Activity  . Alcohol use: No  . Drug use: No  . Sexual activity: Not on file    Comment: married, he is retired  Lifestyle  . Physical activity:    Days per week: Not on file    Minutes per session: Not on file  . Stress: Not on file  Relationships  . Social connections:    Talks on phone: Not on file    Gets together: Not on file    Attends religious service: Not on file    Active member of club or organization: Not on file    Attends meetings of clubs or organizations: Not on file    Relationship status: Not on file  . Intimate partner violence:    Fear of current or ex partner: Not on file    Emotionally abused: Not on file    Physically abused: Not on file    Forced sexual activity: Not on file  Other Topics Concern  . Not on file  Social History Narrative  . Not on file  BP 124/64   Pulse 64   Ht 5\' 7"  (1.702 m)   Wt 161 lb (73 kg)   BMI 25.22 kg/m   Physical Exam:  Well appearing NAD HEENT: Unremarkable Neck:  No JVD, no thyromegally Lymphatics:  No adenopathy Back:  No CVA tenderness Lungs:  Clear HEART:  Regular rate rhythm, no murmurs, no rubs, no clicks Abd:  soft, positive bowel sounds, no organomegally, no rebound, no guarding Ext:  2 plus pulses, no edema, no cyanosis, no clubbing Skin:  No rashes no nodules Neuro:  CN II through XII intact, motor grossly intact  EKG - nsr with p synch ventricular pacing  DEVICE  Normal device function.  See PaceArt for details.   Assess/Plan: 1. CHB - he is asymptomatic, s/p PPM insertion. 2. PPM - his [employer] DDD PM is working normally.  3. Preoperative evaluation - the patient is low risk for pending neck surgery. His PPM could have a magnet applied which would minimize any risk of inhibition. However, he is not PM dependent so  placing the magnet would not be mandatory.  [name].D.

## 2018-05-10 NOTE — Assessment & Plan Note (Signed)
Examination revealed a large cyst which resembled either a sebaceous cyst or lipoma and not a lymph node We discussed referral to either ENT or general surgery Due to the location in the neck, I plan to refer him to see ENT surgeon for management and he agreed to proceed

## 2018-05-17 ENCOUNTER — Encounter (HOSPITAL_BASED_OUTPATIENT_CLINIC_OR_DEPARTMENT_OTHER): Payer: Self-pay | Admitting: *Deleted

## 2018-05-17 NOTE — Progress Notes (Signed)
Dr Pollie Friar office called to confirm that case will be done under local. Confirmed by Olin Hauser.

## 2018-05-21 ENCOUNTER — Ambulatory Visit: Payer: Self-pay | Admitting: Otolaryngology

## 2018-05-21 NOTE — H&P (Signed)
PREOPERATIVE H&P  Chief Complaint: enlarging posterior neck mass  HPI: Douglas [name] is a 72 y.o. male who presents for evaluation of an enlarging posterior left neck mass.this has been enlarging for the past 6 years. The mass protrudes 3-4 cm and measures 5-6 cm in diameter.Patient has history of leukemia. He is taken to the operating room for excision under local anesthesia.  Past Medical History:  Diagnosis Date  . Basal cell carcinoma of right temple region   . Hyperbilirubinemia   . Leukemia ([place])   . 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 Onychomycosis   . 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 Basal Cell Carcinoma Excison  2012  . PACEMAKER IMPLANT N/A 04/07/2017   Procedure: Pacemaker Implant;  Surgeon: Evans Lance, MD;  Location: [facility];  Service: Cardiovascular;  Laterality: N/A;  . SQUAMOUS CELL CARCINOMA EXCISION  2006   Social History   Socioeconomic History  . Marital status: Married    Spouse name: Not on file  . Number of children: Not on file  . Years of education: Not on file  . Highest education level: Not on file  Occupational History  . Not on file  Social Needs  . Financial resource strain: Not on file  . Food insecurity:    Worry: Not on file    Inability: Not on file  . Transportation needs:    Medical: Not on file    Non-medical: Not on file  Tobacco Use  . Smoking status: Never Smoker  . Smokeless tobacco: Never Used  Substance and Sexual Activity  . Alcohol use: No  . Drug use: No  . Sexual activity: Not on file    Comment: married, he is retired  Lifestyle  . Physical activity:    Days per week: Not on file    Minutes per session: Not on file  . Stress: Not on file  Relationships  . Social connections:    Talks on phone: Not on file    Gets together: Not on file    Attends religious service: Not on file    Active member of club or organization: Not on  file    Attends meetings of clubs or organizations: Not on file    Relationship status: Not on file  Other Topics Concern  . Not on file  Social History Narrative  . Not on file   Family History  Problem Relation Age of Onset  . Alzheimer's disease Mother   . Hypertension Father   . Diabetes Father   . Heart attack Father   . Heart attack Brother   . Cancer Brother        CLL   Allergies  Allergen Reactions  . Sulfa Antibiotics Nausea Only and Rash   Prior to Admission medications   Not on File     Positive ROS: not causing any pain  All other systems have been reviewed and were otherwise negative with the exception of those mentioned in the HPI and as above.  Physical Exam: There were no vitals filed for this visit.  General: Alert, no acute distress Oral: Normal oral mucosa and tonsils Nasal: Clear nasal passages Neck: No palpable adenopathy or thyroid nodules anteriorly. He has a 5-6 cm posterior left neck mass. Ear: Ear canal is clear with normal appearing TMs Cardiovascular: Regular rate and rhythm, no murmur.  Respiratory: Clear to auscultation Neurologic: Alert and  oriented x 3   Assessment/Plan: NECK MASS Plan for Procedure(s): EXCISION MASS POSTERIOR NECK WITH INTERMEDIATE CLOSURE   Melony Overly, MD 05/21/2018 1:50 PM

## 2018-05-22 LAB — FISH,CLL PROGNOSTIC PANEL

## 2018-05-25 ENCOUNTER — Encounter (HOSPITAL_BASED_OUTPATIENT_CLINIC_OR_DEPARTMENT_OTHER): Payer: Self-pay | Admitting: Emergency Medicine

## 2018-05-25 ENCOUNTER — Other Ambulatory Visit: Payer: Self-pay

## 2018-05-25 ENCOUNTER — Ambulatory Visit (HOSPITAL_BASED_OUTPATIENT_CLINIC_OR_DEPARTMENT_OTHER)
Admission: RE | Admit: 2018-05-25 | Discharge: 2018-05-25 | Disposition: A | Discharge status: Home / Self Care | Payer: Managed Care, Other (non HMO) | Source: Ambulatory Visit | Attending: Otolaryngology | Admitting: Otolaryngology

## 2018-05-25 ENCOUNTER — Encounter (HOSPITAL_BASED_OUTPATIENT_CLINIC_OR_DEPARTMENT_OTHER)
Admission: RE | Disposition: A | Discharge status: Home / Self Care | Payer: Self-pay | Source: Ambulatory Visit | Attending: Otolaryngology

## 2018-05-25 ENCOUNTER — Ambulatory Visit (HOSPITAL_BASED_OUTPATIENT_CLINIC_OR_DEPARTMENT_OTHER): Payer: Managed Care, Other (non HMO) | Admitting: Anesthesiology

## 2018-05-25 DIAGNOSIS — D170 Benign lipomatous neoplasm of skin and subcutaneous tissue of head, face and neck: Secondary | ICD-10-CM | POA: Insufficient documentation

## 2018-05-25 DIAGNOSIS — Z85828 Personal history of other malignant neoplasm of skin: Secondary | ICD-10-CM | POA: Diagnosis not present

## 2018-05-25 DIAGNOSIS — Z8249 Family history of ischemic heart disease and other diseases of the circulatory system: Secondary | ICD-10-CM | POA: Diagnosis not present

## 2018-05-25 DIAGNOSIS — Z856 Personal history of leukemia: Secondary | ICD-10-CM | POA: Insufficient documentation

## 2018-05-25 DIAGNOSIS — D367 Benign neoplasm of other specified sites: Secondary | ICD-10-CM

## 2018-05-25 DIAGNOSIS — Z950 Presence of cardiac pacemaker: Secondary | ICD-10-CM | POA: Diagnosis not present

## 2018-05-25 DIAGNOSIS — Z882 Allergy status to sulfonamides status: Secondary | ICD-10-CM | POA: Insufficient documentation

## 2018-05-25 DIAGNOSIS — R221 Localized swelling, mass and lump, neck: Secondary | ICD-10-CM | POA: Diagnosis present

## 2018-05-25 HISTORY — PX: MASS EXCISION: SHX2000

## 2018-05-25 SURGERY — MINOR EXCISION OF MASS
Anesthesia: Monitor Anesthesia Care | Site: Neck

## 2018-05-25 MED ORDER — CHLORHEXIDINE GLUCONATE CLOTH 2 % EX PADS: 6.0000 | MEDICATED_PAD | Freq: Once | CUTANEOUS | Status: DC

## 2018-05-25 MED ORDER — MIDAZOLAM HCL 2 MG/2ML IJ SOLN: 1.0000 mg | INTRAMUSCULAR | Status: DC | PRN

## 2018-05-25 MED ORDER — MEPERIDINE HCL 25 MG/ML IJ SOLN
6.2500 mg | INTRAMUSCULAR | Status: DC | PRN
Start: 2018-05-25 — End: 2018-05-25

## 2018-05-25 MED ORDER — SCOPOLAMINE 1 MG/3DAYS TD PT72: 1.0000 | MEDICATED_PATCH | Freq: Once | TRANSDERMAL | Status: DC | PRN

## 2018-05-25 MED ORDER — LIDOCAINE-EPINEPHRINE 1 %-1:100000 IJ SOLN
INTRAMUSCULAR | Status: DC | PRN
  Administered 2018-05-25: 10 mL

## 2018-05-25 MED ORDER — ONDANSETRON HCL 4 MG/2ML IJ SOLN: 4.0000 mg | Freq: Once | INTRAMUSCULAR | Status: DC | PRN

## 2018-05-25 MED ORDER — BACITRACIN ZINC 500 UNIT/GM EX OINT
TOPICAL_OINTMENT | CUTANEOUS | Status: AC
  Filled 2018-05-25: qty 0.9

## 2018-05-25 MED ORDER — CEFAZOLIN SODIUM-DEXTROSE 2-4 GM/100ML-% IV SOLN
INTRAVENOUS | Status: AC
  Filled 2018-05-25: qty 100

## 2018-05-25 MED ORDER — LACTATED RINGERS IV SOLN
INTRAVENOUS | Status: DC
  Administered 2018-05-25: 07:00:00 via INTRAVENOUS

## 2018-05-25 MED ORDER — MUPIROCIN 2 % EX OINT
TOPICAL_OINTMENT | CUTANEOUS | Status: DC | PRN
  Administered 2018-05-25: 1 via TOPICAL

## 2018-05-25 MED ORDER — CEFAZOLIN SODIUM-DEXTROSE 2-3 GM-%(50ML) IV SOLR
INTRAVENOUS | Status: DC | PRN
  Administered 2018-05-25: 2 g via INTRAVENOUS

## 2018-05-25 MED ORDER — LIDOCAINE-EPINEPHRINE 1 %-1:100000 IJ SOLN
INTRAMUSCULAR | Status: AC
  Filled 2018-05-25: qty 1

## 2018-05-25 MED ORDER — HYDROMORPHONE HCL 1 MG/ML IJ SOLN: 0.2500 mg | INTRAMUSCULAR | Status: DC | PRN

## 2018-05-25 MED ORDER — FENTANYL CITRATE (PF) 100 MCG/2ML IJ SOLN: 50.0000 ug | INTRAMUSCULAR | Status: DC | PRN

## 2018-05-25 SURGICAL SUPPLY — 63 items
ADH SKN CLS APL DERMABOND .7 (GAUZE/BANDAGES/DRESSINGS)
APL SKNCLS STERI-STRIP NONHPOA (GAUZE/BANDAGES/DRESSINGS)
BALL CTTN LRG ABS STRL LF (GAUZE/BANDAGES/DRESSINGS)
BANDAGE ADH SHEER 1  50/CT (GAUZE/BANDAGES/DRESSINGS) IMPLANT
BENZOIN TINCTURE PRP APPL 2/3 (GAUZE/BANDAGES/DRESSINGS) IMPLANT
BLADE SURG 15 STRL LF DISP TIS (BLADE) ×1 IMPLANT
BLADE SURG 15 STRL SS (BLADE) ×3
CANISTER SUCT 1200ML W/VALVE (MISCELLANEOUS) ×2 IMPLANT
CAUTERY EYE LOW TEMP 1300F FIN (OPHTHALMIC RELATED) IMPLANT
CLEANER CAUTERY TIP 5X5 PAD (MISCELLANEOUS) IMPLANT
CLOSURE WOUND 1/2 X4 (GAUZE/BANDAGES/DRESSINGS)
CLOSURE WOUND 1/4X4 (GAUZE/BANDAGES/DRESSINGS)
CORD BIPOLAR FORCEPS 12FT (ELECTRODE) IMPLANT
COTTONBALL LRG STERILE PKG (GAUZE/BANDAGES/DRESSINGS) IMPLANT
DECANTER SPIKE VIAL GLASS SM (MISCELLANEOUS) IMPLANT
DERMABOND ADVANCED (GAUZE/BANDAGES/DRESSINGS)
DERMABOND ADVANCED .7 DNX12 (GAUZE/BANDAGES/DRESSINGS) IMPLANT
DRSG EMULSION OIL 3X3 NADH (GAUZE/BANDAGES/DRESSINGS) ×2 IMPLANT
DRSG TEGADERM 4X4.75 (GAUZE/BANDAGES/DRESSINGS) IMPLANT
ELECT COATED BLADE 2.86 ST (ELECTRODE) ×3 IMPLANT
ELECT REM PT RETURN 9FT ADLT (ELECTROSURGICAL) ×3
ELECTRODE REM PT RTRN 9FT ADLT (ELECTROSURGICAL) ×1 IMPLANT
GAUZE SPONGE 4X4 12PLY STRL LF (GAUZE/BANDAGES/DRESSINGS) IMPLANT
GAUZE SPONGE 4X4 16PLY XRAY LF (GAUZE/BANDAGES/DRESSINGS) IMPLANT
GLOVE EXAM NITRILE MD LF STRL (GLOVE) ×2 IMPLANT
GLOVE SS BIOGEL STRL SZ 7.5 (GLOVE) ×1 IMPLANT
GLOVE SUPERSENSE BIOGEL SZ 7.5 (GLOVE) ×2
GLOVE SURG SS PI 7.0 STRL IVOR (GLOVE) ×2 IMPLANT
GOWN STRL REUS W/ TWL LRG LVL3 (GOWN DISPOSABLE) IMPLANT
GOWN STRL REUS W/TWL LRG LVL3 (GOWN DISPOSABLE) ×6
MARKER SKIN DUAL TIP RULER LAB (MISCELLANEOUS) IMPLANT
NDL PRECISIONGLIDE 27X1.5 (NEEDLE) ×1 IMPLANT
NEEDLE PRECISIONGLIDE 27X1.5 (NEEDLE) ×3 IMPLANT
NS IRRIG 1000ML POUR BTL (IV SOLUTION) ×2 IMPLANT
PACK BASIN DAY SURGERY FS (CUSTOM PROCEDURE TRAY) ×2 IMPLANT
PAD CLEANER CAUTERY TIP 5X5 (MISCELLANEOUS)
PENCIL BUTTON HOLSTER BLD 10FT (ELECTRODE) ×3 IMPLANT
SPONGE INTESTINAL PEANUT (DISPOSABLE) IMPLANT
STRIP CLOSURE SKIN 1/2X4 (GAUZE/BANDAGES/DRESSINGS) IMPLANT
STRIP CLOSURE SKIN 1/4X4 (GAUZE/BANDAGES/DRESSINGS) IMPLANT
SUCTION FRAZIER HANDLE 10FR (MISCELLANEOUS)
SUCTION TUBE FRAZIER 10FR DISP (MISCELLANEOUS) IMPLANT
SUT CHROMIC 3 0 PS 2 (SUTURE) ×2 IMPLANT
SUT CHROMIC 4 0 P 3 18 (SUTURE) IMPLANT
SUT ETHILON 5 0 P 3 18 (SUTURE) ×2
SUT ETHILON 6 0 P 1 (SUTURE) IMPLANT
SUT NYLON ETHILON 5-0 P-3 1X18 (SUTURE) IMPLANT
SUT PLAIN 5 0 P 3 18 (SUTURE) IMPLANT
SUT SILK 4 0 TIES 17X18 (SUTURE) IMPLANT
SUT VIC AB 4-0 P-3 18XBRD (SUTURE) IMPLANT
SUT VIC AB 4-0 P3 18 (SUTURE)
SUT VIC AB 5-0 P-3 18X BRD (SUTURE) IMPLANT
SUT VIC AB 5-0 P3 18 (SUTURE)
SWAB COLLECTION DEVICE MRSA (MISCELLANEOUS) IMPLANT
SWAB CULTURE ESWAB REG 1ML (MISCELLANEOUS) IMPLANT
SWABSTICK POVIDONE IODINE SNGL (MISCELLANEOUS) ×2 IMPLANT
SYR BULB 3OZ (MISCELLANEOUS) IMPLANT
SYR CONTROL 10ML LL (SYRINGE) ×3 IMPLANT
TOWEL GREEN STERILE FF (TOWEL DISPOSABLE) ×3 IMPLANT
TRAY DSU PREP LF (CUSTOM PROCEDURE TRAY) ×2 IMPLANT
TUBE CONNECTING 20'X1/4 (TUBING) ×1
TUBE CONNECTING 20X1/4 (TUBING) ×1 IMPLANT
YANKAUER SUCT BULB TIP NO VENT (SUCTIONS) IMPLANT

## 2018-05-25 NOTE — Discharge Instructions (Signed)
Can remove dressing in 24 hrs and apply antibiotic ointment to incision site daily. Tylenol or ibuprofen prn pain Call office for follow up appt in 6 days for next Thursday.  Prescription for Keflex 500mg  by mouth twice daily for 5 days given.  Fill prescription today and take as directed.

## 2018-05-25 NOTE — Transfer of Care (Signed)
Immediate Anesthesia Transfer of Care Note  Patient: Douglas [name]  Procedure(s) Performed: EXCISION POSTERIOR NECK MASS WITH IMMEDIATE CLOSURE (N/A Neck)  Patient Location: PACU  Anesthesia Type:MAC  Level of Consciousness: awake, alert  and oriented  Airway & Oxygen Therapy: Patient Spontanous Breathing  Post-op Assessment: Report given to RN  Post vital signs: Reviewed and stable  Last Vitals:  Vitals Value Taken Time  BP    Temp    Pulse 64 05/25/2018  8:31 AM  Resp 22 05/25/2018  8:31 AM  SpO2 97 % 05/25/2018  8:31 AM  Vitals shown include unvalidated device data.  Last Pain:  Vitals:   05/25/18 0643  TempSrc: Oral  PainSc: 0-No pain         Complications: No apparent anesthesia complications

## 2018-05-25 NOTE — Interval H&P Note (Signed)
History and Physical Interval Note:  05/25/2018 8:41 AM  Douglas [name]  has presented today for surgery, with the diagnosis of NECK MASS  The various methods of treatment have been discussed with the patient and family. After consideration of risks, benefits and other options for treatment, the patient has consented to  Procedure(s): EXCISION POSTERIOR NECK MASS WITH IMMEDIATE CLOSURE (N/A) as a surgical intervention .  The patient's history has been reviewed, patient examined, no change in status, stable for surgery.  I have reviewed the patient's chart and labs.  Questions were answered to the patient's satisfaction.     Melony Overly

## 2018-05-25 NOTE — Anesthesia Preprocedure Evaluation (Signed)
Anesthesia Evaluation  Patient identified by MRN, date of birth, ID band Patient awake    Reviewed: Allergy & Precautions, NPO status , Patient's Chart, lab work & pertinent test results  Airway Mallampati: I  TM Distance: >3 FB Neck ROM: Full    Dental   Pulmonary    Pulmonary exam normal        Cardiovascular Normal cardiovascular exam+ pacemaker      Neuro/Psych    GI/Hepatic   Endo/Other    Renal/GU      Musculoskeletal   Abdominal   Peds  Hematology   Anesthesia Other Findings   Reproductive/Obstetrics                             Anesthesia Physical Anesthesia Plan  ASA: III  Anesthesia Plan: MAC   Post-op Pain Management:    Induction: Intravenous  PONV Risk Score and Plan: 1 and Treatment may vary due to age or medical condition  Airway Management Planned: Simple Face Mask  Additional Equipment:   Intra-op Plan:   Post-operative Plan:   Informed Consent: I have reviewed the patients History and Physical, chart, labs and discussed the procedure including the risks, benefits and alternatives for the proposed anesthesia with the patient or authorized representative who has indicated his/her understanding and acceptance.     Plan Discussed with: CRNA and Surgeon  Anesthesia Plan Comments:         Anesthesia Quick Evaluation

## 2018-05-25 NOTE — Op Note (Signed)
NAME: Douglas, [name] MEDICAL RECORD [id number] ACCOUNT [id number] DATE OF BIRTH:11-Jun-1946 FACILITY: [facility] LOCATION: [place], [place]  OPERATIVE REPORT  DATE OF PROCEDURE:  05/25/2018  PREOPERATIVE DIAGNOSIS:  Posterior neck mass.  POSTOPERATIVE DIAGNOSIS:  Posterior neck mass consistent with a probable lipoma.  PROCEDURE:  Excision of posterior left neck mass with intermediate closure approximately 6 cm.  SURGEON:  Melony Overly, MD  ANESTHESIA:  MAC with local 1% Xylocaine with epinephrine 10 mL.  ESTIMATED BLOOD LOSS:  Minimal.  COMPLICATIONS:  None.  BRIEF CLINICAL NOTE:  The patient is a 72 year old gentleman who has a history of leukemia.  He has had a gradually enlarging posterior neck mass over the past 6 years.  It protrudes and measures approximately 5-6 cm.  It is solid and slightly firm to  palpation and consistent with probable lipoma versus cyst.  He is taken to the operating room at this time for excision of a posterior neck mass and local anesthetic.  Because of cardiac history, MAC was required.  DESCRIPTION OF PROCEDURE:  The patient was brought to the operating room.  He received 1 gram Ancef IV preoperatively.  He laid in the supine position on the table.  The mass was palpated and incision site was marked out.  This area was then prepped with  a Betadine solution, draped out with sterile towels.  A horizontal incision was made directly over the mass.  Dissection was carried down through the subcutaneous tissue with cautery and scissors.  The mass was encountered after getting through the  subcutaneous tissue and was consistent with an encapsulated lipoma.  This was bluntly and sharply dissected out.  Small feeding vessels were cauterized with cautery.  Mass was removed and sent to pathology.  Hemostasis was obtained with the cautery.  The  defect was then closed with 3-0 chromic suture subcutaneously and a 5-0 nylon to  reapproximate the skin edges.  Mupirocin ointment and pressure dressing was applied.  DISPOSITION:  The patient is discharged home later this morning.  He was instructed to leave the dressing in place for 24 hours.  He may then get it wet and apply antibiotic ointment to the incision site.  He will follow up in my office in 6 to 7 days  for recheck to have sutures removed and review final pathology.  TN/NUANCE  D:05/25/2018 T:05/25/2018 JOB:[id number]

## 2018-05-25 NOTE — Anesthesia Postprocedure Evaluation (Signed)
Anesthesia Post Note  Patient: Douglas [name]  Procedure(s) Performed: EXCISION POSTERIOR NECK MASS WITH IMMEDIATE CLOSURE (N/A Neck)     Patient location during evaluation: PACU Anesthesia Type: MAC Level of consciousness: awake and alert Pain management: pain level controlled Vital Signs Assessment: post-procedure vital signs reviewed and stable Respiratory status: spontaneous breathing, nonlabored ventilation, respiratory function stable and patient connected to nasal cannula oxygen Cardiovascular status: stable and blood pressure returned to baseline Postop Assessment: no apparent nausea or vomiting Anesthetic complications: no    Last Vitals:  Vitals:   05/25/18 0840 05/25/18 0905  BP: (!) 146/80 (!) 147/75  Pulse: 60 60  Resp: 15 16  Temp:  36.7 C  SpO2: 100% 100%    Last Pain:  Vitals:   05/25/18 0905  TempSrc:   PainSc: 0-No pain                 Deigo Alonso DAVID

## 2018-05-25 NOTE — Brief Op Note (Signed)
05/25/2018  8:26 AM  PATIENT:  Douglas [name]  73 y.o. male  PRE-OPERATIVE DIAGNOSIS:  NECK MASS  POST-OPERATIVE DIAGNOSIS:  NECK MASS. Findings consistent with lipoma  PROCEDURE:  Procedure(s): EXCISION POSTERIOR NECK MASS WITH IMMEDIATE CLOSURE (N/A)  6 cm  SURGEON:  Surgeon(s) and Role:    Rozetta Nunnery, MD - Primary  PHYSICIAN ASSISTANT:   ASSISTANTS: none   ANESTHESIA:   local and MAC  EBL:  5 mL   BLOOD ADMINISTERED:none  DRAINS: none   LOCAL MEDICATIONS USED:  XYLOCAINE with epi  10 cc  SPECIMEN:  Source of Specimen:  posterior neck  DISPOSITION OF SPECIMEN:  PATHOLOGY  COUNTS:  YES  TOURNIQUET:  * No tourniquets in log *  DICTATION: .Other Dictation: Dictation Number [id number]  PLAN OF CARE: Discharge to home after PACU  PATIENT DISPOSITION:  PACU - hemodynamically stable.   Delay start of Pharmacological VTE agent (>24hrs) due to surgical blood loss or risk of bleeding: yes

## 2018-05-28 ENCOUNTER — Encounter (HOSPITAL_BASED_OUTPATIENT_CLINIC_OR_DEPARTMENT_OTHER): Payer: Self-pay | Admitting: Otolaryngology

## 2018-05-29 LAB — CUP PACEART REMOTE DEVICE CHECK
Battery Remaining Longevity: 124 mo
Battery Voltage: 3.02 V
Brady Statistic AP VP Percent: 14 %
Brady Statistic AS VP Percent: 39 %
Brady Statistic RA Percent Paced: 23 %
Brady Statistic RV Percent Paced: 53 %
Date Time Interrogation Session: 20190604160445
Implantable Lead Implant Date: 20180504
Implantable Lead Implant Date: 20180504
Implantable Lead Location: 753860
Implantable Pulse Generator Implant Date: 20180504
Lead Channel Impedance Value: 590 Ohm
Lead Channel Pacing Threshold Amplitude: 1.125 V
Lead Channel Pacing Threshold Pulse Width: 0.5 ms
Lead Channel Sensing Intrinsic Amplitude: 12 mV
Lead Channel Setting Pacing Amplitude: 2.125
MDC IDC LEAD LOCATION: 753859
MDC IDC MSMT BATTERY REMAINING PERCENTAGE: 95 %
MDC IDC MSMT LEADCHNL RA IMPEDANCE VALUE: 590 Ohm
MDC IDC MSMT LEADCHNL RA SENSING INTR AMPL: 5 mV
MDC IDC MSMT LEADCHNL RV PACING THRESHOLD AMPLITUDE: 0.5 V
MDC IDC MSMT LEADCHNL RV PACING THRESHOLD PULSEWIDTH: 0.5 ms
MDC IDC SET LEADCHNL RV PACING AMPLITUDE: 0.75 V
MDC IDC SET LEADCHNL RV PACING PULSEWIDTH: 0.5 ms
MDC IDC SET LEADCHNL RV SENSING SENSITIVITY: 4 mV
MDC IDC STAT BRADY AP VS PERCENT: 9.5 %
MDC IDC STAT BRADY AS VS PERCENT: 37 %
Pulse Gen Model: 2272
Pulse Gen Serial Number: 8900450

## 2018-08-07 ENCOUNTER — Ambulatory Visit (INDEPENDENT_AMBULATORY_CARE_PROVIDER_SITE_OTHER): Payer: Managed Care, Other (non HMO) | Admitting: *Deleted

## 2018-08-07 DIAGNOSIS — I442 Atrioventricular block, complete: Secondary | ICD-10-CM

## 2018-08-08 NOTE — Progress Notes (Signed)
Remote pacemaker transmission.   

## 2018-09-03 LAB — CUP PACEART REMOTE DEVICE CHECK
Battery Remaining Longevity: 132 mo
Battery Remaining Percentage: 95.5 %
Brady Statistic AP VS Percent: 7.5 %
Brady Statistic AS VP Percent: 33 %
Brady Statistic RV Percent Paced: 51 %
Date Time Interrogation Session: 20190909031002
Implantable Lead Implant Date: 20180504
Implantable Lead Implant Date: 20180504
Implantable Pulse Generator Implant Date: 20180504
Lead Channel Impedance Value: 550 Ohm
Lead Channel Impedance Value: 560 Ohm
Lead Channel Pacing Threshold Amplitude: 0.5 V
Lead Channel Sensing Intrinsic Amplitude: 12 mV
Lead Channel Sensing Intrinsic Amplitude: 5 mV
Lead Channel Setting Sensing Sensitivity: 4 mV
MDC IDC LEAD LOCATION: 753859
MDC IDC LEAD LOCATION: 753860
MDC IDC MSMT BATTERY VOLTAGE: 3.02 V
MDC IDC MSMT LEADCHNL RA PACING THRESHOLD AMPLITUDE: 0.5 V
MDC IDC MSMT LEADCHNL RA PACING THRESHOLD PULSEWIDTH: 0.5 ms
MDC IDC MSMT LEADCHNL RV PACING THRESHOLD PULSEWIDTH: 0.5 ms
MDC IDC SET LEADCHNL RA PACING AMPLITUDE: 1.5 V
MDC IDC SET LEADCHNL RV PACING AMPLITUDE: 0.75 V
MDC IDC SET LEADCHNL RV PACING PULSEWIDTH: 0.5 ms
MDC IDC STAT BRADY AP VP PERCENT: 18 %
MDC IDC STAT BRADY AS VS PERCENT: 41 %
MDC IDC STAT BRADY RA PERCENT PACED: 25 %
Pulse Gen Model: 2272
Pulse Gen Serial Number: 8900450

## 2018-11-05 IMAGING — DX DG CHEST 1V PORT
1 series · 1 of 1 positions shown · non-contrast
Comparison: 11/24/2006

CLINICAL DATA: Heart block

EXAM:
PORTABLE CHEST 1 VIEW

[chest ap]
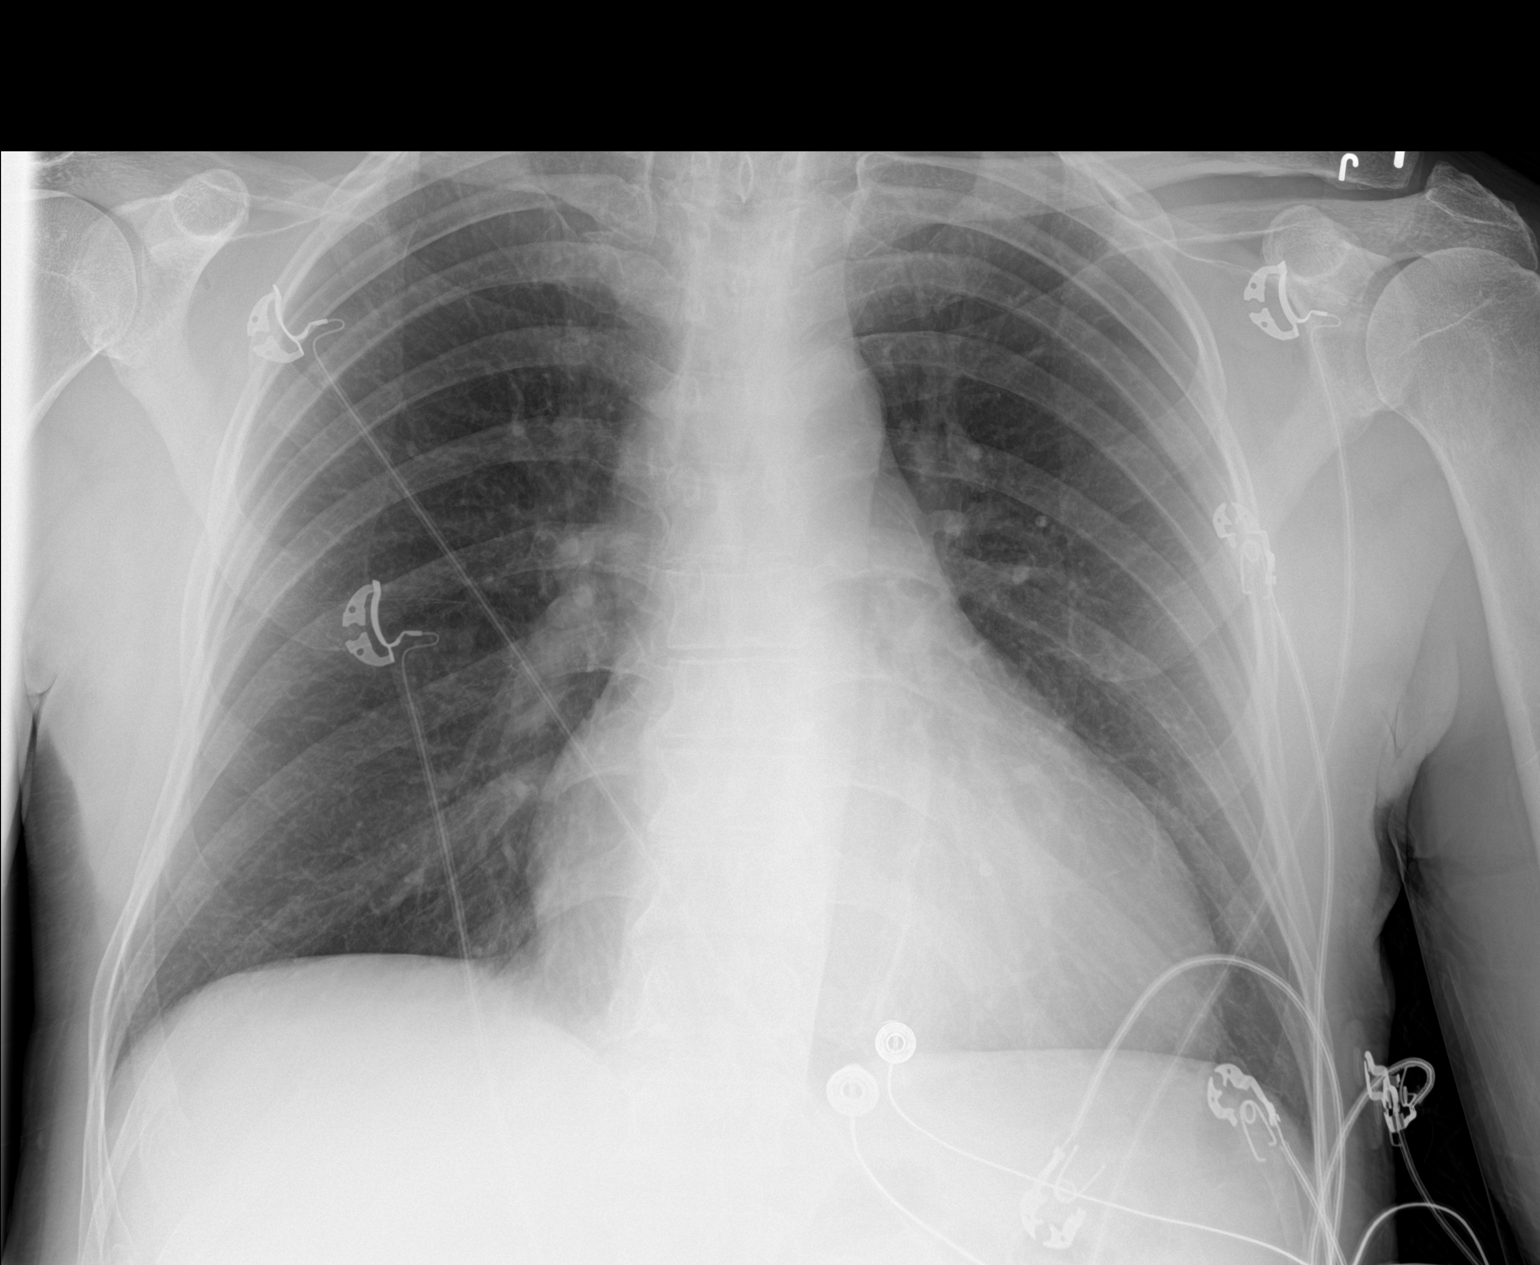

[1 of 1 positions shown; findings below may reference images not displayed]

FINDINGS: Mild cardiomegaly. Lungs are clear. No effusions or edema. No acute
bony abnormality.
IMPRESSION: Cardiomegaly.  No active disease.

## 2018-11-06 ENCOUNTER — Ambulatory Visit (INDEPENDENT_AMBULATORY_CARE_PROVIDER_SITE_OTHER): Payer: Managed Care, Other (non HMO)

## 2018-11-06 ENCOUNTER — Telehealth: Payer: Self-pay

## 2018-11-06 DIAGNOSIS — I442 Atrioventricular block, complete: Secondary | ICD-10-CM

## 2018-11-06 NOTE — Telephone Encounter (Signed)
Spoke with pt and reminded pt of remote transmission that is due today. Pt verbalized understanding.   

## 2018-11-07 NOTE — Progress Notes (Signed)
Remote pacemaker transmission.   

## 2018-11-13 ENCOUNTER — Encounter: Payer: Self-pay | Admitting: Cardiology

## 2018-12-27 LAB — CUP PACEART REMOTE DEVICE CHECK
Battery Remaining Longevity: 133 mo
Battery Voltage: 3.02 V
Brady Statistic AS VP Percent: 31 %
Brady Statistic AS VS Percent: 42 %
Brady Statistic RA Percent Paced: 26 %
Date Time Interrogation Session: 20191203161916
Implantable Lead Implant Date: 20180504
Implantable Lead Location: 753860
Lead Channel Impedance Value: 510 Ohm
Lead Channel Impedance Value: 600 Ohm
Lead Channel Pacing Threshold Amplitude: 1 V
Lead Channel Pacing Threshold Pulse Width: 0.5 ms
Lead Channel Sensing Intrinsic Amplitude: 12 mV
Lead Channel Setting Pacing Amplitude: 2 V
MDC IDC LEAD IMPLANT DT: 20180504
MDC IDC LEAD LOCATION: 753859
MDC IDC MSMT BATTERY REMAINING PERCENTAGE: 95.5 %
MDC IDC MSMT LEADCHNL RA SENSING INTR AMPL: 5 mV
MDC IDC MSMT LEADCHNL RV PACING THRESHOLD AMPLITUDE: 0.625 V
MDC IDC MSMT LEADCHNL RV PACING THRESHOLD PULSEWIDTH: 0.5 ms
MDC IDC PG IMPLANT DT: 20180504
MDC IDC SET LEADCHNL RV PACING AMPLITUDE: 0.875
MDC IDC SET LEADCHNL RV PACING PULSEWIDTH: 0.5 ms
MDC IDC SET LEADCHNL RV SENSING SENSITIVITY: 4 mV
MDC IDC STAT BRADY AP VP PERCENT: 17 %
MDC IDC STAT BRADY AP VS PERCENT: 8.9 %
MDC IDC STAT BRADY RV PERCENT PACED: 49 %
Pulse Gen Model: 2272
Pulse Gen Serial Number: 8900450

## 2019-02-05 ENCOUNTER — Encounter: Payer: Managed Care, Other (non HMO) | Admitting: *Deleted

## 2019-02-06 ENCOUNTER — Telehealth: Payer: Self-pay

## 2019-02-06 NOTE — Telephone Encounter (Signed)
Left message for patient to remind of missed remote transmission.  

## 2019-02-11 ENCOUNTER — Ambulatory Visit (INDEPENDENT_AMBULATORY_CARE_PROVIDER_SITE_OTHER): Payer: Managed Care, Other (non HMO) | Admitting: *Deleted

## 2019-02-11 DIAGNOSIS — I442 Atrioventricular block, complete: Secondary | ICD-10-CM | POA: Diagnosis not present

## 2019-02-11 DIAGNOSIS — R001 Bradycardia, unspecified: Secondary | ICD-10-CM

## 2019-02-17 LAB — CUP PACEART REMOTE DEVICE CHECK
Battery Remaining Longevity: 131 mo
Battery Remaining Percentage: 95.5 %
Brady Statistic AS VP Percent: 38 %
Brady Statistic RA Percent Paced: 26 %
Date Time Interrogation Session: 20200307102901
Implantable Lead Implant Date: 20180504
Implantable Lead Location: 753860
Implantable Pulse Generator Implant Date: 20180504
Lead Channel Impedance Value: 490 Ohm
Lead Channel Pacing Threshold Amplitude: 0.5 V
Lead Channel Sensing Intrinsic Amplitude: 5 mV
Lead Channel Setting Pacing Amplitude: 0.875
Lead Channel Setting Pacing Amplitude: 1.5 V
Lead Channel Setting Pacing Pulse Width: 0.5 ms
Lead Channel Setting Sensing Sensitivity: 4 mV
MDC IDC LEAD IMPLANT DT: 20180504
MDC IDC LEAD LOCATION: 753859
MDC IDC MSMT BATTERY VOLTAGE: 3.01 V
MDC IDC MSMT LEADCHNL RA PACING THRESHOLD PULSEWIDTH: 0.5 ms
MDC IDC MSMT LEADCHNL RV IMPEDANCE VALUE: 580 Ohm
MDC IDC MSMT LEADCHNL RV PACING THRESHOLD AMPLITUDE: 0.625 V
MDC IDC MSMT LEADCHNL RV PACING THRESHOLD PULSEWIDTH: 0.5 ms
MDC IDC MSMT LEADCHNL RV SENSING INTR AMPL: 12 mV
MDC IDC STAT BRADY AP VP PERCENT: 18 %
MDC IDC STAT BRADY AP VS PERCENT: 8.4 %
MDC IDC STAT BRADY AS VS PERCENT: 36 %
MDC IDC STAT BRADY RV PERCENT PACED: 56 %
Pulse Gen Model: 2272
Pulse Gen Serial Number: 8900450

## 2019-02-18 ENCOUNTER — Other Ambulatory Visit: Payer: Self-pay

## 2019-02-19 NOTE — Progress Notes (Signed)
Remote pacemaker transmission.   

## 2019-05-09 ENCOUNTER — Other Ambulatory Visit: Payer: Managed Care, Other (non HMO)

## 2019-05-09 ENCOUNTER — Ambulatory Visit: Payer: Managed Care, Other (non HMO) | Admitting: Hematology and Oncology

## 2019-05-13 ENCOUNTER — Ambulatory Visit (INDEPENDENT_AMBULATORY_CARE_PROVIDER_SITE_OTHER): Payer: Managed Care, Other (non HMO) | Admitting: *Deleted

## 2019-05-13 DIAGNOSIS — I442 Atrioventricular block, complete: Secondary | ICD-10-CM

## 2019-05-13 DIAGNOSIS — R001 Bradycardia, unspecified: Secondary | ICD-10-CM

## 2019-05-14 ENCOUNTER — Telehealth: Payer: Self-pay

## 2019-05-14 NOTE — Telephone Encounter (Signed)
Left message for patient to remind of missed remote transmission.  

## 2019-05-15 LAB — CUP PACEART REMOTE DEVICE CHECK
Battery Remaining Longevity: 133 mo
Battery Remaining Percentage: 95.5 %
Battery Voltage: 3.01 V
Brady Statistic AP VP Percent: 17 %
Brady Statistic AP VS Percent: 9.6 %
Brady Statistic AS VP Percent: 37 %
Brady Statistic AS VS Percent: 36 %
Brady Statistic RA Percent Paced: 26 %
Brady Statistic RV Percent Paced: 55 %
Date Time Interrogation Session: 20200610090522
Implantable Lead Implant Date: 20180504
Implantable Lead Implant Date: 20180504
Implantable Lead Location: 753859
Implantable Lead Location: 753860
Implantable Pulse Generator Implant Date: 20180504
Lead Channel Impedance Value: 560 Ohm
Lead Channel Impedance Value: 590 Ohm
Lead Channel Pacing Threshold Amplitude: 0.5 V
Lead Channel Pacing Threshold Amplitude: 0.625 V
Lead Channel Pacing Threshold Pulse Width: 0.5 ms
Lead Channel Pacing Threshold Pulse Width: 0.5 ms
Lead Channel Sensing Intrinsic Amplitude: 12 mV
Lead Channel Sensing Intrinsic Amplitude: 5 mV
Lead Channel Setting Pacing Amplitude: 0.875
Lead Channel Setting Pacing Amplitude: 1.5 V
Lead Channel Setting Pacing Pulse Width: 0.5 ms
Lead Channel Setting Sensing Sensitivity: 4 mV
Pulse Gen Model: 2272
Pulse Gen Serial Number: 8900450

## 2019-05-21 NOTE — Progress Notes (Signed)
Remote pacemaker transmission.   

## 2019-08-13 ENCOUNTER — Ambulatory Visit (INDEPENDENT_AMBULATORY_CARE_PROVIDER_SITE_OTHER): Payer: Managed Care, Other (non HMO) | Admitting: *Deleted

## 2019-08-13 DIAGNOSIS — I442 Atrioventricular block, complete: Secondary | ICD-10-CM | POA: Diagnosis not present

## 2019-08-18 LAB — CUP PACEART REMOTE DEVICE CHECK
Battery Remaining Longevity: 132 mo
Battery Remaining Percentage: 95.5 %
Battery Voltage: 3.01 V
Brady Statistic AP VP Percent: 18 %
Brady Statistic AP VS Percent: 9.4 %
Brady Statistic AS VP Percent: 38 %
Brady Statistic AS VS Percent: 34 %
Brady Statistic RA Percent Paced: 27 %
Brady Statistic RV Percent Paced: 57 %
Date Time Interrogation Session: 20200912074524
Implantable Lead Implant Date: 20180504
Implantable Lead Implant Date: 20180504
Implantable Lead Location: 753859
Implantable Lead Location: 753860
Implantable Pulse Generator Implant Date: 20180504
Lead Channel Impedance Value: 510 Ohm
Lead Channel Impedance Value: 590 Ohm
Lead Channel Pacing Threshold Amplitude: 0.5 V
Lead Channel Pacing Threshold Amplitude: 0.5 V
Lead Channel Pacing Threshold Pulse Width: 0.5 ms
Lead Channel Pacing Threshold Pulse Width: 0.5 ms
Lead Channel Sensing Intrinsic Amplitude: 12 mV
Lead Channel Sensing Intrinsic Amplitude: 5 mV
Lead Channel Setting Pacing Amplitude: 0.75 V
Lead Channel Setting Pacing Amplitude: 1.5 V
Lead Channel Setting Pacing Pulse Width: 0.5 ms
Lead Channel Setting Sensing Sensitivity: 4 mV
Pulse Gen Model: 2272
Pulse Gen Serial Number: 8900450

## 2019-08-28 ENCOUNTER — Encounter: Payer: Self-pay | Admitting: Cardiology

## 2019-08-28 NOTE — Progress Notes (Signed)
Remote pacemaker transmission.   

## 2019-12-30 ENCOUNTER — Ambulatory Visit (INDEPENDENT_AMBULATORY_CARE_PROVIDER_SITE_OTHER): Payer: Managed Care, Other (non HMO) | Admitting: *Deleted

## 2019-12-30 DIAGNOSIS — I442 Atrioventricular block, complete: Secondary | ICD-10-CM | POA: Diagnosis not present

## 2019-12-30 LAB — CUP PACEART REMOTE DEVICE CHECK
Battery Remaining Longevity: 132 mo
Battery Remaining Percentage: 95.5 %
Battery Voltage: 3.01 V
Brady Statistic AP VP Percent: 20 %
Brady Statistic AP VS Percent: 7.5 %
Brady Statistic AS VP Percent: 46 %
Brady Statistic AS VS Percent: 26 %
Brady Statistic RA Percent Paced: 27 %
Brady Statistic RV Percent Paced: 66 %
Date Time Interrogation Session: 20210125130627
Implantable Lead Implant Date: 20180504
Implantable Lead Implant Date: 20180504
Implantable Lead Location: 753859
Implantable Lead Location: 753860
Implantable Pulse Generator Implant Date: 20180504
Lead Channel Impedance Value: 480 Ohm
Lead Channel Impedance Value: 590 Ohm
Lead Channel Pacing Threshold Amplitude: 0.375 V
Lead Channel Pacing Threshold Amplitude: 0.5 V
Lead Channel Pacing Threshold Pulse Width: 0.5 ms
Lead Channel Pacing Threshold Pulse Width: 0.5 ms
Lead Channel Sensing Intrinsic Amplitude: 12 mV
Lead Channel Sensing Intrinsic Amplitude: 5 mV
Lead Channel Setting Pacing Amplitude: 0.75 V
Lead Channel Setting Pacing Amplitude: 1.375
Lead Channel Setting Pacing Pulse Width: 0.5 ms
Lead Channel Setting Sensing Sensitivity: 4 mV
Pulse Gen Model: 2272
Pulse Gen Serial Number: 8900450

## 2020-03-31 ENCOUNTER — Telehealth: Payer: Self-pay

## 2020-03-31 NOTE — Telephone Encounter (Signed)
Spoke with patient to remind of missed remote transmission 

## 2020-04-13 ENCOUNTER — Ambulatory Visit (INDEPENDENT_AMBULATORY_CARE_PROVIDER_SITE_OTHER): Payer: Managed Care, Other (non HMO) | Admitting: *Deleted

## 2020-04-13 DIAGNOSIS — I442 Atrioventricular block, complete: Secondary | ICD-10-CM | POA: Diagnosis not present

## 2020-04-13 LAB — CUP PACEART REMOTE DEVICE CHECK
Battery Remaining Longevity: 130 mo
Battery Remaining Percentage: 95.5 %
Battery Voltage: 3.01 V
Brady Statistic AP VP Percent: 20 %
Brady Statistic AP VS Percent: 6.4 %
Brady Statistic AS VP Percent: 50 %
Brady Statistic AS VS Percent: 23 %
Brady Statistic RA Percent Paced: 26 %
Brady Statistic RV Percent Paced: 71 %
Date Time Interrogation Session: 20210509112517
Implantable Lead Implant Date: 20180504
Implantable Lead Implant Date: 20180504
Implantable Lead Location: 753859
Implantable Lead Location: 753860
Implantable Pulse Generator Implant Date: 20180504
Lead Channel Impedance Value: 440 Ohm
Lead Channel Impedance Value: 580 Ohm
Lead Channel Pacing Threshold Amplitude: 0.5 V
Lead Channel Pacing Threshold Amplitude: 0.5 V
Lead Channel Pacing Threshold Pulse Width: 0.5 ms
Lead Channel Pacing Threshold Pulse Width: 0.5 ms
Lead Channel Sensing Intrinsic Amplitude: 12 mV
Lead Channel Sensing Intrinsic Amplitude: 5 mV
Lead Channel Setting Pacing Amplitude: 0.75 V
Lead Channel Setting Pacing Amplitude: 1.5 V
Lead Channel Setting Pacing Pulse Width: 0.5 ms
Lead Channel Setting Sensing Sensitivity: 4 mV
Pulse Gen Model: 2272
Pulse Gen Serial Number: 8900450

## 2020-04-14 NOTE — Progress Notes (Signed)
Remote pacemaker transmission.   

## 2020-05-17 ENCOUNTER — Other Ambulatory Visit: Payer: Self-pay

## 2020-05-17 ENCOUNTER — Encounter (HOSPITAL_COMMUNITY): Payer: Self-pay

## 2020-05-17 ENCOUNTER — Ambulatory Visit (HOSPITAL_COMMUNITY)
Admission: EM | Admit: 2020-05-17 | Discharge: 2020-05-17 | Disposition: A | Discharge status: Home / Self Care | Payer: Managed Care, Other (non HMO) | Attending: Physician Assistant | Admitting: Physician Assistant

## 2020-05-17 DIAGNOSIS — L239 Allergic contact dermatitis, unspecified cause: Secondary | ICD-10-CM

## 2020-05-17 DIAGNOSIS — R21 Rash and other nonspecific skin eruption: Secondary | ICD-10-CM

## 2020-05-17 MED ORDER — TRIAMCINOLONE ACETONIDE 0.1 % EX CREA
1.0000 | TOPICAL_CREAM | Freq: Two times a day (BID) | CUTANEOUS | 0 refills | Status: AC
Start: 2020-05-17 — End: ?

## 2020-05-17 NOTE — ED Provider Notes (Signed)
[facility]    CSN: [id number] Arrival date & time: 05/17/20  1028      History   Chief Complaint Chief Complaint  Patient presents with  . Rash    HPI Douglas [name] is a 74 y.o. male.   Patient here c/w Rash on right leg and foot x 5 days.  Rash has remained unchanged since initial appearance.  Admits itchiness, erythema.  Denies pain, tenderness, swelling.  He has tried benadryl and neosporin w/o relief.     Past Medical History:  Diagnosis Date  . Basal cell carcinoma of right temple region   . Hyperbilirubinemia   . Leukemia ([place])   . 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 Onychomycosis   . 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 Dermoid cyst of neck 05/10/2018  . Dyslipidemia 04/08/2017  . Family history of coronary artery disease in brother 04/08/2017  . Cardiac pacemaker placed 04/07/17 04/08/2017  . Complete heart block ([place]) 04/07/2017  . History of skin cancer 05/13/2015  . High bilirubin 05/13/2015  . CLL (chronic lymphocytic leukemia) ([place]) 11/06/2012    Past Surgical History:  Procedure Laterality Date  . Basal Cell Carcinoma Excison  2012  . MASS EXCISION N/A 05/25/2018   Procedure: EXCISION POSTERIOR NECK MASS WITH IMMEDIATE CLOSURE;  Surgeon: Rozetta Nunnery, MD;  Location: [facility];  Service: ENT;  Laterality: N/A;  . PACEMAKER IMPLANT N/A 04/07/2017   Procedure: Pacemaker Implant;  Surgeon: Evans Lance, MD;  Location: [facility];  Service: Cardiovascular;  Laterality: N/A;  . SQUAMOUS CELL CARCINOMA EXCISION  2006       Home Medications    Prior to Admission medications   Medication Sig Start Date End Date Taking? Authorizing Provider  triamcinolone cream (KENALOG) 0.1 % Apply 1 application topically 2 (two) times daily. 05/17/20   Peri Jefferson, PA-C    Family History Family History  Problem Relation Age of Onset  .  Alzheimer's disease Mother   . Hypertension Father   . Diabetes Father   . Heart attack Father   . Heart attack Brother   . Cancer Brother        CLL    Social History Social History   Tobacco Use  . Smoking status: Never Smoker  . Smokeless tobacco: Never Used  Substance Use Topics  . Alcohol use: No  . Drug use: No     Allergies   Sulfa antibiotics   Review of Systems Review of Systems  Constitutional: Negative for chills, fatigue and fever.  HENT: Negative for sore throat.   Cardiovascular: Negative for chest pain, palpitations and leg swelling.  Gastrointestinal: Negative for abdominal pain, diarrhea, nausea and vomiting.  Musculoskeletal: Negative for arthralgias, back pain, gait problem and myalgias.  Skin: Positive for rash. Negative for color change.  Neurological: Negative for light-headedness and headaches.  Hematological: Negative for adenopathy. Does not bruise/bleed easily.  Psychiatric/Behavioral: Negative for confusion and sleep disturbance.  All other systems reviewed and are negative.    Physical Exam Triage Vital Signs ED Triage Vitals [05/17/20 1043]  Enc Vitals Group     BP 118/71     Pulse Rate 65     Resp 16     Temp 97.8 F (36.6 C)     Temp src      SpO2 98 %     Weight  Height      Head Circumference      Peak Flow      Pain Score 0     Pain Loc      Pain Edu?      Excl. in [place]?    No data found.  Updated Vital Signs BP 118/71   Pulse 65   Temp 97.8 F (36.6 C)   Resp 16   SpO2 98%   Visual Acuity Right Eye Distance:   Left Eye Distance:   Bilateral Distance:    Right Eye Near:   Left Eye Near:    Bilateral Near:     Physical Exam Vitals and nursing note reviewed.  Constitutional:      Appearance: Normal appearance. He is well-developed.  HENT:     Head: Normocephalic and atraumatic.     Nose: Nose normal.     Mouth/Throat:     Mouth: Mucous membranes are moist.  Eyes:     General: No scleral  icterus.    Conjunctiva/sclera: Conjunctivae normal.  Pulmonary:     Effort: Pulmonary effort is normal. No respiratory distress.  Musculoskeletal:        General: No swelling or deformity. Normal range of motion.     Cervical back: Normal range of motion and neck supple. No rigidity.  Skin:    General: Skin is warm and dry.     Capillary Refill: Capillary refill takes less than 2 seconds.     Findings: Rash present. Rash is vesicular.          Comments: Vesicular rash on R foot, linear fashion across toes 1 - 4  Erythematous vesiular rash posterior R calf, 6 x 4 cm, will blanch, non tender, no warmth.  Neurological:     General: No focal deficit present.     Mental Status: He is alert and oriented to person, place, and time.     Motor: No weakness.     Gait: Gait normal.  Psychiatric:        Mood and Affect: Mood normal.        Behavior: Behavior normal.      UC Treatments / Results  Labs (all labs ordered are listed, but only abnormal results are displayed) Labs Reviewed - No data to display  EKG   Radiology No results found.  Procedures Procedures (including critical care time)  Medications Ordered in UC Medications - No data to display  Initial Impression / Assessment and Plan / UC Course  I have reviewed the triage vital signs and the nursing notes.  Pertinent labs & imaging results that were available during my care of the patient were reviewed by me and considered in my medical decision making (see chart for details).     Rash appears allergic in nature, will treat with triamcinolone cream, follow up with PCP or return with no improvement. Final Clinical Impressions(s) / UC Diagnoses   Final diagnoses:  Allergic contact dermatitis, unspecified trigger  Rash     Discharge Instructions     Use cream as directed. Keep rash open if possible. Follow up with PCP if no improvement.    ED Prescriptions    Medication Sig Dispense Auth. Provider    triamcinolone cream (KENALOG) 0.1 % Apply 1 application topically 2 (two) times daily. 80 g Peri Jefferson, PA-C     PDMP not reviewed this encounter.   Peri Jefferson, PA-C 05/17/20 1110

## 2020-05-17 NOTE — ED Triage Notes (Signed)
Pt c/o rash to right lower leg and foot since Tuesday. Triple antibiotic ointment and benadryl not helping.

## 2020-05-17 NOTE — Discharge Instructions (Addendum)
Use cream as directed. Keep rash open if possible. Follow up with PCP or return if no improvement.

## 2020-05-18 ENCOUNTER — Emergency Department (HOSPITAL_COMMUNITY)
Admission: EM | Admit: 2020-05-18 | Discharge: 2020-05-18 | Disposition: A | Discharge status: Home / Self Care | Payer: Managed Care, Other (non HMO) | Attending: Emergency Medicine | Admitting: Emergency Medicine

## 2020-05-18 ENCOUNTER — Other Ambulatory Visit: Payer: Self-pay

## 2020-05-18 ENCOUNTER — Encounter (HOSPITAL_COMMUNITY): Payer: Self-pay | Admitting: Emergency Medicine

## 2020-05-18 DIAGNOSIS — R21 Rash and other nonspecific skin eruption: Secondary | ICD-10-CM | POA: Insufficient documentation

## 2020-05-18 DIAGNOSIS — Z5321 Procedure and treatment not carried out due to patient leaving prior to being seen by health care provider: Secondary | ICD-10-CM | POA: Insufficient documentation

## 2020-05-18 NOTE — ED Triage Notes (Signed)
Pt. Stated, I think I got bit by a spider or something. Rt. Leg is red and little red bites everywhere on feet and legs.

## 2020-05-18 NOTE — ED Notes (Signed)
Pt decided to leave the emergency department 

## 2020-10-12 ENCOUNTER — Ambulatory Visit (INDEPENDENT_AMBULATORY_CARE_PROVIDER_SITE_OTHER): Payer: Managed Care, Other (non HMO)

## 2020-10-12 DIAGNOSIS — I442 Atrioventricular block, complete: Secondary | ICD-10-CM | POA: Diagnosis not present

## 2020-10-16 LAB — CUP PACEART REMOTE DEVICE CHECK
Battery Remaining Longevity: 130 mo
Battery Remaining Percentage: 95.5 %
Battery Voltage: 3.01 V
Brady Statistic AP VP Percent: 21 %
Brady Statistic AP VS Percent: 5 %
Brady Statistic AS VP Percent: 56 %
Brady Statistic AS VS Percent: 18 %
Brady Statistic RA Percent Paced: 25 %
Brady Statistic RV Percent Paced: 77 %
Date Time Interrogation Session: 20211112014820
Implantable Lead Implant Date: 20180504
Implantable Lead Implant Date: 20180504
Implantable Lead Location: 753859
Implantable Lead Location: 753860
Implantable Pulse Generator Implant Date: 20180504
Lead Channel Impedance Value: 440 Ohm
Lead Channel Impedance Value: 560 Ohm
Lead Channel Pacing Threshold Amplitude: 0.375 V
Lead Channel Pacing Threshold Amplitude: 0.5 V
Lead Channel Pacing Threshold Pulse Width: 0.5 ms
Lead Channel Pacing Threshold Pulse Width: 0.5 ms
Lead Channel Sensing Intrinsic Amplitude: 12 mV
Lead Channel Sensing Intrinsic Amplitude: 5 mV
Lead Channel Setting Pacing Amplitude: 0.75 V
Lead Channel Setting Pacing Amplitude: 1.375
Lead Channel Setting Pacing Pulse Width: 0.5 ms
Lead Channel Setting Sensing Sensitivity: 4 mV
Pulse Gen Model: 2272
Pulse Gen Serial Number: 8900450

## 2020-10-16 NOTE — Progress Notes (Signed)
Remote pacemaker transmission.   

## 2021-01-11 ENCOUNTER — Ambulatory Visit (INDEPENDENT_AMBULATORY_CARE_PROVIDER_SITE_OTHER): Payer: Managed Care, Other (non HMO)

## 2021-01-11 DIAGNOSIS — I442 Atrioventricular block, complete: Secondary | ICD-10-CM

## 2021-01-13 LAB — CUP PACEART REMOTE DEVICE CHECK
Battery Remaining Longevity: 130 mo
Battery Remaining Percentage: 95.5 %
Battery Voltage: 3.01 V
Brady Statistic AP VP Percent: 21 %
Brady Statistic AP VS Percent: 4.6 %
Brady Statistic AS VP Percent: 58 %
Brady Statistic AS VS Percent: 16 %
Brady Statistic RA Percent Paced: 25 %
Brady Statistic RV Percent Paced: 79 %
Date Time Interrogation Session: 20220208220949
Implantable Lead Implant Date: 20180504
Implantable Lead Implant Date: 20180504
Implantable Lead Location: 753859
Implantable Lead Location: 753860
Implantable Pulse Generator Implant Date: 20180504
Lead Channel Impedance Value: 430 Ohm
Lead Channel Impedance Value: 560 Ohm
Lead Channel Pacing Threshold Amplitude: 0.5 V
Lead Channel Pacing Threshold Amplitude: 0.625 V
Lead Channel Pacing Threshold Pulse Width: 0.5 ms
Lead Channel Pacing Threshold Pulse Width: 0.5 ms
Lead Channel Sensing Intrinsic Amplitude: 12 mV
Lead Channel Sensing Intrinsic Amplitude: 5 mV
Lead Channel Setting Pacing Amplitude: 0.875
Lead Channel Setting Pacing Amplitude: 1.5 V
Lead Channel Setting Pacing Pulse Width: 0.5 ms
Lead Channel Setting Sensing Sensitivity: 4 mV
Pulse Gen Model: 2272
Pulse Gen Serial Number: 8900450

## 2021-01-15 NOTE — Progress Notes (Signed)
Remote pacemaker transmission.   

## 2021-04-12 ENCOUNTER — Ambulatory Visit (INDEPENDENT_AMBULATORY_CARE_PROVIDER_SITE_OTHER): Payer: Managed Care, Other (non HMO)

## 2021-04-12 DIAGNOSIS — I442 Atrioventricular block, complete: Secondary | ICD-10-CM | POA: Diagnosis not present

## 2021-04-14 LAB — CUP PACEART REMOTE DEVICE CHECK
Battery Remaining Longevity: 131 mo
Battery Remaining Percentage: 95.5 %
Battery Voltage: 2.99 V
Brady Statistic AP VP Percent: 20 %
Brady Statistic AP VS Percent: 4.3 %
Brady Statistic AS VP Percent: 60 %
Brady Statistic AS VS Percent: 15 %
Brady Statistic RA Percent Paced: 24 %
Brady Statistic RV Percent Paced: 80 %
Date Time Interrogation Session: 20220510040013
Implantable Lead Implant Date: 20180504
Implantable Lead Implant Date: 20180504
Implantable Lead Location: 753859
Implantable Lead Location: 753860
Implantable Pulse Generator Implant Date: 20180504
Lead Channel Impedance Value: 460 Ohm
Lead Channel Impedance Value: 590 Ohm
Lead Channel Pacing Threshold Amplitude: 0.5 V
Lead Channel Pacing Threshold Amplitude: 0.5 V
Lead Channel Pacing Threshold Pulse Width: 0.5 ms
Lead Channel Pacing Threshold Pulse Width: 0.5 ms
Lead Channel Sensing Intrinsic Amplitude: 12 mV
Lead Channel Sensing Intrinsic Amplitude: 3.9 mV
Lead Channel Setting Pacing Amplitude: 0.75 V
Lead Channel Setting Pacing Amplitude: 1.5 V
Lead Channel Setting Pacing Pulse Width: 0.5 ms
Lead Channel Setting Sensing Sensitivity: 4 mV
Pulse Gen Model: 2272
Pulse Gen Serial Number: 8900450

## 2021-04-30 NOTE — Progress Notes (Signed)
Remote pacemaker transmission.   

## 2021-07-12 ENCOUNTER — Ambulatory Visit (INDEPENDENT_AMBULATORY_CARE_PROVIDER_SITE_OTHER): Payer: Managed Care, Other (non HMO)

## 2021-07-12 DIAGNOSIS — I442 Atrioventricular block, complete: Secondary | ICD-10-CM | POA: Diagnosis not present

## 2021-07-13 LAB — CUP PACEART REMOTE DEVICE CHECK
Battery Remaining Longevity: 79 mo
Battery Remaining Percentage: 62 %
Battery Voltage: 2.99 V
Brady Statistic AP VP Percent: 21 %
Brady Statistic AP VS Percent: 4 %
Brady Statistic AS VP Percent: 61 %
Brady Statistic AS VS Percent: 14 %
Brady Statistic RA Percent Paced: 24 %
Brady Statistic RV Percent Paced: 81 %
Date Time Interrogation Session: 20220808043943
Implantable Lead Implant Date: 20180504
Implantable Lead Implant Date: 20180504
Implantable Lead Location: 753859
Implantable Lead Location: 753860
Implantable Pulse Generator Implant Date: 20180504
Lead Channel Impedance Value: 440 Ohm
Lead Channel Impedance Value: 590 Ohm
Lead Channel Pacing Threshold Amplitude: 0.5 V
Lead Channel Pacing Threshold Amplitude: 0.5 V
Lead Channel Pacing Threshold Pulse Width: 0.5 ms
Lead Channel Pacing Threshold Pulse Width: 0.5 ms
Lead Channel Sensing Intrinsic Amplitude: 12 mV
Lead Channel Sensing Intrinsic Amplitude: 5 mV
Lead Channel Setting Pacing Amplitude: 0.75 V
Lead Channel Setting Pacing Amplitude: 1.5 V
Lead Channel Setting Pacing Pulse Width: 0.5 ms
Lead Channel Setting Sensing Sensitivity: 4 mV
Pulse Gen Model: 2272
Pulse Gen Serial Number: 8900450

## 2021-07-28 NOTE — Progress Notes (Deleted)
Electrophysiology Office Note Date: 07/28/2021  ID:  Douglas [name], Douglas [name] 12/16/1945, MRN [id number]  PCP: Patient, No Pcp Per (Inactive) Primary Cardiologist: None Electrophysiologist: Cristopher Peru, MD   CC: Pacemaker follow-up  Douglas [name] is a 75 y.o. male seen today for Cristopher Peru, MD for routine electrophysiology followup.  Since last being seen in our clinic the patient reports doing ***.  he denies chest pain, palpitations, dyspnea, PND, orthopnea, nausea, vomiting, dizziness, syncope, edema, weight gain, or early satiety.  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    Past Surgical History:  Procedure Laterality Date   Basal Cell Carcinoma Excison  2012   MASS EXCISION N/A 05/25/2018   Procedure: EXCISION POSTERIOR NECK MASS WITH IMMEDIATE CLOSURE;  Surgeon: Rozetta Nunnery, MD;  Location: [facility];  Service: ENT;  Laterality: N/A;   PACEMAKER IMPLANT N/A 04/07/2017   Procedure: Pacemaker Implant;  Surgeon: Evans Lance, MD;  Location: [facility];  Service: Cardiovascular;  Laterality: N/A;   SQUAMOUS CELL CARCINOMA EXCISION  2006    Current Outpatient Medications  Medication Sig Dispense Refill   triamcinolone cream (KENALOG) 0.1 % Apply 1 application topically 2 (two) times daily. 80 g 0   No current facility-administered medications for this visit.    Allergies:   Sulfa antibiotics   Social History: Social History   Socioeconomic History   Marital status: Married    Spouse name: Not on file   Number of children: Not on file   Years of education: Not on file   Highest education level: Not on file  Occupational History   Not on file  Tobacco Use   Smoking status:  Never   Smokeless tobacco: Never  Substance and Sexual Activity   Alcohol use: No   Drug use: No   Sexual activity: Not on file    Comment: married, he is retired  Other Topics Concern   Not on file  Social History Narrative   Not on file   Social Determinants of Health   Financial Resource Strain: Not on file  Food Insecurity: Not on file  Transportation Needs: Not on file  Physical Activity: Not on file  Stress: Not on file  Social Connections: Not on file  Intimate Partner Violence: Not on file    Family History: Family History  Problem Relation Age of Onset   Alzheimer's disease Mother    Hypertension Father    Diabetes Father    Heart attack Father    Heart attack Brother    Cancer Brother        CLL     Review of Systems: All other systems reviewed and are otherwise negative except as noted above.  Physical Exam: There were no vitals filed for this visit.   GEN- The patient is well appearing, alert and oriented x 3 today.   HEENT: normocephalic, atraumatic; sclera clear, conjunctiva pink; hearing intact; oropharynx clear; neck supple  Lungs- Clear to ausculation bilaterally, normal work of breathing.  No wheezes, rales, rhonchi Heart- Regular rate and rhythm, no murmurs, rubs or gallops  GI- soft, non-tender, non-distended, bowel sounds present  Extremities- no clubbing or cyanosis. No edema  MS- no significant deformity or atrophy Skin- warm and dry, no rash or lesion; PPM pocket well healed Psych- euthymic mood, full affect Neuro- strength and sensation are intact  PPM Interrogation- reviewed in detail today,  See PACEART report  EKG:  EKG is ordered today. Personal review of ekg ordered today shows ***   Recent Labs: No results found for requested labs within last 8760 hours.   Wt Readings from Last 3 Encounters:  05/18/20 170 lb (77.1 kg)  05/25/18 160 lb (72.6 kg)  05/10/18 161 lb (73 kg)     Other studies Reviewed: Additional studies/  records that were reviewed today include: Previous EP office notes, Previous remote checks, Most recent labwork.   Assessment and Plan:  1. CHB s/p St. Jude PPM  Normal PPM function See Pace Art report No changes today   Current medicines are reviewed at length with the patient today.   The patient does not have concerns regarding his medicines.    Labs/ tests ordered today include: *** No orders of the defined types were placed in this encounter.    Disposition:   Follow up with Dr. Lovena Le in 12 Months    Signed, Annamaria Helling  07/28/2021 9:08 PM  [place] [address] [phone] (office) [phone] (fax)

## 2021-07-29 ENCOUNTER — Encounter: Payer: Managed Care, Other (non HMO) | Admitting: Student

## 2021-08-04 NOTE — Progress Notes (Signed)
Remote pacemaker transmission.   

## 2021-10-11 ENCOUNTER — Ambulatory Visit (INDEPENDENT_AMBULATORY_CARE_PROVIDER_SITE_OTHER): Payer: Managed Care, Other (non HMO)

## 2021-10-11 DIAGNOSIS — I442 Atrioventricular block, complete: Secondary | ICD-10-CM | POA: Diagnosis not present

## 2021-10-11 LAB — CUP PACEART REMOTE DEVICE CHECK
Battery Remaining Longevity: 76 mo
Battery Remaining Percentage: 59 %
Battery Voltage: 2.99 V
Brady Statistic AP VP Percent: 21 %
Brady Statistic AP VS Percent: 3.7 %
Brady Statistic AS VP Percent: 62 %
Brady Statistic AS VS Percent: 13 %
Brady Statistic RA Percent Paced: 23 %
Brady Statistic RV Percent Paced: 83 %
Date Time Interrogation Session: 20221107030036
Implantable Lead Implant Date: 20180504
Implantable Lead Implant Date: 20180504
Implantable Lead Location: 753859
Implantable Lead Location: 753860
Implantable Pulse Generator Implant Date: 20180504
Lead Channel Impedance Value: 450 Ohm
Lead Channel Impedance Value: 580 Ohm
Lead Channel Pacing Threshold Amplitude: 0.5 V
Lead Channel Pacing Threshold Amplitude: 0.5 V
Lead Channel Pacing Threshold Pulse Width: 0.5 ms
Lead Channel Pacing Threshold Pulse Width: 0.5 ms
Lead Channel Sensing Intrinsic Amplitude: 12 mV
Lead Channel Sensing Intrinsic Amplitude: 4 mV
Lead Channel Setting Pacing Amplitude: 0.75 V
Lead Channel Setting Pacing Amplitude: 1.5 V
Lead Channel Setting Pacing Pulse Width: 0.5 ms
Lead Channel Setting Sensing Sensitivity: 4 mV
Pulse Gen Model: 2272
Pulse Gen Serial Number: 8900450

## 2021-10-18 NOTE — Progress Notes (Signed)
Remote pacemaker transmission.   

## 2022-01-10 ENCOUNTER — Ambulatory Visit (INDEPENDENT_AMBULATORY_CARE_PROVIDER_SITE_OTHER): Payer: Managed Care, Other (non HMO)

## 2022-01-10 DIAGNOSIS — I442 Atrioventricular block, complete: Secondary | ICD-10-CM | POA: Diagnosis not present

## 2022-01-11 LAB — CUP PACEART REMOTE DEVICE CHECK
Battery Remaining Longevity: 72 mo
Battery Remaining Percentage: 57 %
Battery Voltage: 2.99 V
Brady Statistic AP VP Percent: 21 %
Brady Statistic AP VS Percent: 3.4 %
Brady Statistic AS VP Percent: 63 %
Brady Statistic AS VS Percent: 12 %
Brady Statistic RA Percent Paced: 24 %
Brady Statistic RV Percent Paced: 84 %
Date Time Interrogation Session: 20230206165856
Implantable Lead Implant Date: 20180504
Implantable Lead Implant Date: 20180504
Implantable Lead Location: 753859
Implantable Lead Location: 753860
Implantable Pulse Generator Implant Date: 20180504
Lead Channel Impedance Value: 410 Ohm
Lead Channel Impedance Value: 560 Ohm
Lead Channel Pacing Threshold Amplitude: 0.5 V
Lead Channel Pacing Threshold Amplitude: 0.5 V
Lead Channel Pacing Threshold Pulse Width: 0.5 ms
Lead Channel Pacing Threshold Pulse Width: 0.5 ms
Lead Channel Sensing Intrinsic Amplitude: 12 mV
Lead Channel Sensing Intrinsic Amplitude: 5 mV
Lead Channel Setting Pacing Amplitude: 0.75 V
Lead Channel Setting Pacing Amplitude: 1.5 V
Lead Channel Setting Pacing Pulse Width: 0.5 ms
Lead Channel Setting Sensing Sensitivity: 4 mV
Pulse Gen Model: 2272
Pulse Gen Serial Number: 8900450

## 2022-01-13 NOTE — Progress Notes (Signed)
Remote pacemaker transmission.   

## 2022-04-11 ENCOUNTER — Ambulatory Visit (INDEPENDENT_AMBULATORY_CARE_PROVIDER_SITE_OTHER): Payer: Managed Care, Other (non HMO)

## 2022-04-11 DIAGNOSIS — I442 Atrioventricular block, complete: Secondary | ICD-10-CM

## 2022-04-11 LAB — CUP PACEART REMOTE DEVICE CHECK
Battery Remaining Longevity: 70 mo
Battery Remaining Percentage: 54 %
Battery Voltage: 2.99 V
Brady Statistic AP VP Percent: 21 %
Brady Statistic AP VS Percent: 3.2 %
Brady Statistic AS VP Percent: 63 %
Brady Statistic AS VS Percent: 12 %
Brady Statistic RA Percent Paced: 23 %
Brady Statistic RV Percent Paced: 85 %
Date Time Interrogation Session: 20230508043729
Implantable Lead Implant Date: 20180504
Implantable Lead Implant Date: 20180504
Implantable Lead Location: 753859
Implantable Lead Location: 753860
Implantable Pulse Generator Implant Date: 20180504
Lead Channel Impedance Value: 490 Ohm
Lead Channel Impedance Value: 590 Ohm
Lead Channel Pacing Threshold Amplitude: 0.5 V
Lead Channel Pacing Threshold Amplitude: 0.5 V
Lead Channel Pacing Threshold Pulse Width: 0.5 ms
Lead Channel Pacing Threshold Pulse Width: 0.5 ms
Lead Channel Sensing Intrinsic Amplitude: 12 mV
Lead Channel Sensing Intrinsic Amplitude: 5 mV
Lead Channel Setting Pacing Amplitude: 0.75 V
Lead Channel Setting Pacing Amplitude: 1.5 V
Lead Channel Setting Pacing Pulse Width: 0.5 ms
Lead Channel Setting Sensing Sensitivity: 4 mV
Pulse Gen Model: 2272
Pulse Gen Serial Number: 8900450

## 2022-04-27 NOTE — Progress Notes (Signed)
Remote pacemaker transmission.   

## 2022-07-11 ENCOUNTER — Ambulatory Visit (INDEPENDENT_AMBULATORY_CARE_PROVIDER_SITE_OTHER): Payer: Managed Care, Other (non HMO)

## 2022-07-11 DIAGNOSIS — I442 Atrioventricular block, complete: Secondary | ICD-10-CM | POA: Diagnosis not present

## 2022-07-11 LAB — CUP PACEART REMOTE DEVICE CHECK
Battery Remaining Longevity: 67 mo
Battery Remaining Percentage: 52 %
Battery Voltage: 2.99 V
Brady Statistic AP VP Percent: 21 %
Brady Statistic AP VS Percent: 3.7 %
Brady Statistic AS VP Percent: 61 %
Brady Statistic AS VS Percent: 14 %
Brady Statistic RA Percent Paced: 23 %
Brady Statistic RV Percent Paced: 82 %
Date Time Interrogation Session: 20230805020015
Implantable Lead Implant Date: 20180504
Implantable Lead Implant Date: 20180504
Implantable Lead Location: 753859
Implantable Lead Location: 753860
Implantable Pulse Generator Implant Date: 20180504
Lead Channel Impedance Value: 540 Ohm
Lead Channel Impedance Value: 610 Ohm
Lead Channel Pacing Threshold Amplitude: 0.375 V
Lead Channel Pacing Threshold Amplitude: 0.375 V
Lead Channel Pacing Threshold Pulse Width: 0.5 ms
Lead Channel Pacing Threshold Pulse Width: 0.5 ms
Lead Channel Sensing Intrinsic Amplitude: 12 mV
Lead Channel Sensing Intrinsic Amplitude: 5 mV
Lead Channel Setting Pacing Amplitude: 0.625
Lead Channel Setting Pacing Amplitude: 1.375
Lead Channel Setting Pacing Pulse Width: 0.5 ms
Lead Channel Setting Sensing Sensitivity: 4 mV
Pulse Gen Model: 2272
Pulse Gen Serial Number: 8900450

## 2022-08-03 ENCOUNTER — Encounter: Payer: Self-pay | Admitting: Student

## 2022-08-03 ENCOUNTER — Ambulatory Visit: Payer: Managed Care, Other (non HMO) | Attending: Student | Admitting: Student

## 2022-08-03 VITALS — BP 128/72 | HR 78 | Ht 67.0 in | Wt 162.0 lb

## 2022-08-03 DIAGNOSIS — R011 Cardiac murmur, unspecified: Secondary | ICD-10-CM

## 2022-08-03 DIAGNOSIS — I442 Atrioventricular block, complete: Secondary | ICD-10-CM

## 2022-08-03 LAB — CUP PACEART INCLINIC DEVICE CHECK
Battery Remaining Longevity: 66 mo
Battery Voltage: 2.99 V
Brady Statistic RA Percent Paced: 23 %
Brady Statistic RV Percent Paced: 81 %
Date Time Interrogation Session: 20230830104817
Implantable Lead Implant Date: 20180504
Implantable Lead Implant Date: 20180504
Implantable Lead Location: 753859
Implantable Lead Location: 753860
Implantable Pulse Generator Implant Date: 20180504
Lead Channel Impedance Value: 487.5 Ohm
Lead Channel Impedance Value: 612.5 Ohm
Lead Channel Pacing Threshold Amplitude: 0.5 V
Lead Channel Pacing Threshold Amplitude: 0.5 V
Lead Channel Pacing Threshold Pulse Width: 0.5 ms
Lead Channel Pacing Threshold Pulse Width: 0.5 ms
Lead Channel Sensing Intrinsic Amplitude: 12 mV
Lead Channel Sensing Intrinsic Amplitude: 4.7 mV
Lead Channel Setting Pacing Amplitude: 0.75 V
Lead Channel Setting Pacing Amplitude: 1.5 V
Lead Channel Setting Pacing Pulse Width: 0.5 ms
Lead Channel Setting Sensing Sensitivity: 4 mV
Pulse Gen Model: 2272
Pulse Gen Serial Number: 8900450

## 2022-08-03 NOTE — Progress Notes (Signed)
Electrophysiology Office Note Date: 08/03/2022  ID:  Douglas, [name] January 23, 1946, MRN [id number]  PCP: Patient, No Pcp Per Primary Cardiologist: None Electrophysiologist: Douglas Peru, MD   CC: Pacemaker follow-up  Douglas [name] is a 76 y.o. male seen today for Douglas Peru, MD for routine electrophysiology followup after long absence from our clinic. Last seen in 05/2018.    Since last being seen in our clinic the patient reports doing very well from a cardiac perspective.  he denies chest pain, palpitations, dyspnea, PND, orthopnea, nausea, vomiting, dizziness, syncope, edema, weight gain, or early satiety.  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[place])    Onychomycosis    Squamous cell carcinoma of skin of earlobe    Past Surgical History:  Procedure Laterality Date   Basal Cell Carcinoma Excison  2012   MASS EXCISION N/A 05/25/2018   Procedure: EXCISION POSTERIOR NECK MASS WITH IMMEDIATE CLOSURE;  Surgeon: Douglas Nunnery, MD;  Location: [facility];  Service: ENT;  Laterality: N/A;   PACEMAKER IMPLANT N/A 04/07/2017   Procedure: Pacemaker Implant;  Surgeon: Douglas Lance, MD;  Location: [facility];  Service: Cardiovascular;  Laterality: N/A;   SQUAMOUS CELL CARCINOMA EXCISION  2006    Current Outpatient Medications  Medication Sig Dispense Refill   fluconazole (DIFLUCAN) 200 MG tablet Take 200 mg by mouth every other day.     triamcinolone cream (KENALOG) 0.1 % Apply 1 application topically 2 (two) times daily. 80 g 0   No current facility-administered medications for this visit.    Allergies:   Sulfa antibiotics   Social History: Social History   Socioeconomic History   Marital status: Married    Spouse name: Not on file   Number of  children: Not on file   Years of education: Not on file   Highest education level: Not on file  Occupational History   Not on file  Tobacco Use   Smoking status: Never   Smokeless tobacco: Never  Substance and Sexual Activity   Alcohol use: No   Drug use: No   Sexual activity: Not on file    Comment: married, he is retired  Other Topics Concern   Not on file  Social History Narrative   Not on file   Social Determinants of Health   Financial Resource Strain: Not on file  Food Insecurity: Not on file  Transportation Needs: Not on file  Physical Activity: Not on file  Stress: Not on file  Social Connections: Not on file  Intimate Partner Violence: Not on file    Family History: Family History  Problem Relation Age of Onset   Alzheimer's disease Mother    Hypertension Father    Diabetes Father    Heart attack Father    Heart attack Brother    Cancer Brother        CLL     Review of Systems: All other systems reviewed and are otherwise negative except as noted above.  Physical Exam: Vitals:   08/03/22 0952  BP: 128/72  Pulse: 78  SpO2: 97%  Weight: 162 lb (73.5 kg)  Height: '5\' 7"'$  (1.702 m)     GEN- The patient is well appearing, alert and oriented x 3 today.   HEENT:  normocephalic, atraumatic; sclera clear, conjunctiva pink; hearing intact; oropharynx clear; neck supple  Lungs- Clear to ausculation bilaterally, normal work of breathing.  No wheezes, rales, rhonchi Heart- Regular rate and rhythm. 8-2/9 systolic murmur.  GI- soft, non-tender, non-distended, bowel sounds present  Extremities- no clubbing or cyanosis. No edema MS- no significant deformity or atrophy Skin- warm and dry, no rash or lesion; PPM pocket well healed Psych- euthymic mood, full affect Neuro- strength and sensation are intact  PPM Interrogation- reviewed in detail today,  See PACEART report  EKG:  EKG is ordered today. Personal review of ekg ordered today shows V pacing at 76 bpm    Recent Labs: No results found for requested labs within last 365 days.   Wt Readings from Last 3 Encounters:  08/03/22 162 lb (73.5 kg)  05/18/20 170 lb (77.1 kg)  05/25/18 160 lb (72.6 kg)     Other studies Reviewed: Additional studies/ records that were reviewed today include: Previous EP office notes, Previous remote checks, Most recent labwork.   Assessment and Plan:  1. CHB s/p St. Jude PPM  Normal PPM function See Pace Art report No changes today  2. Systolic Murmur Asymptomatic.  No prior echo on file. Will order.   3. AT/SVT Asymptomatic. Declines BB at this time.   Current medicines are reviewed at length with the patient today.      Disposition:   Follow up with Dr. Lovena Le in 12 months    Signed, Douglas Friar, PA-C  08/03/2022 9:56 AM  [facility] [place] [address] [place] [address] [phone] (office) [phone] (fax)

## 2022-08-03 NOTE — Patient Instructions (Signed)
Medication Instructions:  Your physician recommends that you continue on your current medications as directed. Please refer to the Current Medication list given to you today.  *If you need a refill on your cardiac medications before your next appointment, please call your pharmacy*   Lab Work: None If you have labs (blood work) drawn today and your tests are completely normal, you will receive your results only by: [facility] (if you have MyChart) OR A paper copy in the mail If you have any lab test that is abnormal or we need to change your treatment, we will call you to review the results.   Testing/Procedures: Your physician has requested that you have an echocardiogram. Echocardiography is a painless test that uses sound waves to create images of your heart. It provides your doctor with information about the size and shape of your heart and how well your heart's chambers and valves are working. This procedure takes approximately one hour. There are no restrictions for this procedure.   Follow-Up: At [facility], you and your health needs are our priority.  As part of our continuing mission to provide you with exceptional heart care, we have created designated Provider Care Teams.  These Care Teams include your primary Cardiologist (physician) and Advanced Practice Providers (APPs -  Physician Assistants and Nurse Practitioners) who all work together to provide you with the care you need, when you need it.   Your next appointment:   1 year(s)  The format for your next appointment:   In Person  Provider:   Cristopher Peru, MD

## 2022-08-10 NOTE — Progress Notes (Signed)
Remote pacemaker transmission.   

## 2022-08-17 ENCOUNTER — Ambulatory Visit (HOSPITAL_COMMUNITY): Payer: Managed Care, Other (non HMO) | Attending: Student

## 2022-08-17 DIAGNOSIS — R011 Cardiac murmur, unspecified: Secondary | ICD-10-CM

## 2022-08-17 DIAGNOSIS — I442 Atrioventricular block, complete: Secondary | ICD-10-CM

## 2022-08-17 LAB — ECHOCARDIOGRAM COMPLETE
AR max vel: 1.13 cm2
AV Area VTI: 1.21 cm2
AV Area mean vel: 1.2 cm2
AV Mean grad: 13.2 mmHg
AV Peak grad: 22 mmHg
Ao pk vel: 2.35 m/s
Area-P 1/2: 3.37 cm2
S' Lateral: 3.4 cm

## 2022-10-10 ENCOUNTER — Ambulatory Visit (INDEPENDENT_AMBULATORY_CARE_PROVIDER_SITE_OTHER): Payer: Managed Care, Other (non HMO)

## 2022-10-10 DIAGNOSIS — I442 Atrioventricular block, complete: Secondary | ICD-10-CM | POA: Diagnosis not present

## 2022-10-11 LAB — CUP PACEART REMOTE DEVICE CHECK
Battery Remaining Longevity: 61 mo
Battery Remaining Percentage: 49 %
Battery Voltage: 2.99 V
Brady Statistic AP VP Percent: 21 %
Brady Statistic AP VS Percent: 1 %
Brady Statistic AS VP Percent: 77 %
Brady Statistic AS VS Percent: 1 %
Brady Statistic RA Percent Paced: 20 %
Brady Statistic RV Percent Paced: 98 %
Date Time Interrogation Session: 20231106030013
Implantable Lead Connection Status: 753985
Implantable Lead Connection Status: 753985
Implantable Lead Implant Date: 20180504
Implantable Lead Implant Date: 20180504
Implantable Lead Location: 753859
Implantable Lead Location: 753860
Implantable Pulse Generator Implant Date: 20180504
Lead Channel Impedance Value: 510 Ohm
Lead Channel Impedance Value: 600 Ohm
Lead Channel Pacing Threshold Amplitude: 0.5 V
Lead Channel Pacing Threshold Amplitude: 0.5 V
Lead Channel Pacing Threshold Pulse Width: 0.5 ms
Lead Channel Pacing Threshold Pulse Width: 0.5 ms
Lead Channel Sensing Intrinsic Amplitude: 12 mV
Lead Channel Sensing Intrinsic Amplitude: 5 mV
Lead Channel Setting Pacing Amplitude: 0.75 V
Lead Channel Setting Pacing Amplitude: 1.5 V
Lead Channel Setting Pacing Pulse Width: 0.5 ms
Lead Channel Setting Sensing Sensitivity: 4 mV
Pulse Gen Model: 2272
Pulse Gen Serial Number: 8900450

## 2022-11-10 NOTE — Progress Notes (Signed)
Remote pacemaker transmission.   

## 2023-01-09 ENCOUNTER — Ambulatory Visit: Payer: Managed Care, Other (non HMO)

## 2023-01-09 DIAGNOSIS — I442 Atrioventricular block, complete: Secondary | ICD-10-CM | POA: Diagnosis not present

## 2023-01-10 LAB — CUP PACEART REMOTE DEVICE CHECK
Battery Remaining Longevity: 59 mo
Battery Remaining Percentage: 47 %
Battery Voltage: 2.99 V
Brady Statistic AP VP Percent: 16 %
Brady Statistic AP VS Percent: 1.9 %
Brady Statistic AS VP Percent: 75 %
Brady Statistic AS VS Percent: 6.2 %
Brady Statistic RA Percent Paced: 17 %
Brady Statistic RV Percent Paced: 91 %
Date Time Interrogation Session: 20240205073059
Implantable Lead Connection Status: 753985
Implantable Lead Connection Status: 753985
Implantable Lead Implant Date: 20180504
Implantable Lead Implant Date: 20180504
Implantable Lead Location: 753859
Implantable Lead Location: 753860
Implantable Pulse Generator Implant Date: 20180504
Lead Channel Impedance Value: 510 Ohm
Lead Channel Impedance Value: 630 Ohm
Lead Channel Pacing Threshold Amplitude: 0.5 V
Lead Channel Pacing Threshold Amplitude: 0.625 V
Lead Channel Pacing Threshold Pulse Width: 0.5 ms
Lead Channel Pacing Threshold Pulse Width: 0.5 ms
Lead Channel Sensing Intrinsic Amplitude: 12 mV
Lead Channel Sensing Intrinsic Amplitude: 5 mV
Lead Channel Setting Pacing Amplitude: 0.75 V
Lead Channel Setting Pacing Amplitude: 1.625
Lead Channel Setting Pacing Pulse Width: 0.5 ms
Lead Channel Setting Sensing Sensitivity: 4 mV
Pulse Gen Model: 2272
Pulse Gen Serial Number: 8900450

## 2023-02-22 NOTE — Progress Notes (Signed)
Remote pacemaker transmission.   

## 2023-04-10 ENCOUNTER — Ambulatory Visit (INDEPENDENT_AMBULATORY_CARE_PROVIDER_SITE_OTHER): Payer: Managed Care, Other (non HMO)

## 2023-04-10 DIAGNOSIS — I442 Atrioventricular block, complete: Secondary | ICD-10-CM

## 2023-04-11 LAB — CUP PACEART REMOTE DEVICE CHECK
Battery Remaining Longevity: 56 mo
Battery Remaining Percentage: 44 %
Battery Voltage: 2.98 V
Brady Statistic AP VP Percent: 15 %
Brady Statistic AP VS Percent: 1.7 %
Brady Statistic AS VP Percent: 77 %
Brady Statistic AS VS Percent: 6 %
Brady Statistic RA Percent Paced: 16 %
Brady Statistic RV Percent Paced: 92 %
Date Time Interrogation Session: 20240506040018
Implantable Lead Connection Status: 753985
Implantable Lead Connection Status: 753985
Implantable Lead Implant Date: 20180504
Implantable Lead Implant Date: 20180504
Implantable Lead Location: 753859
Implantable Lead Location: 753860
Implantable Pulse Generator Implant Date: 20180504
Lead Channel Impedance Value: 480 Ohm
Lead Channel Impedance Value: 640 Ohm
Lead Channel Pacing Threshold Amplitude: 0.5 V
Lead Channel Pacing Threshold Amplitude: 0.5 V
Lead Channel Pacing Threshold Pulse Width: 0.5 ms
Lead Channel Pacing Threshold Pulse Width: 0.5 ms
Lead Channel Sensing Intrinsic Amplitude: 12 mV
Lead Channel Sensing Intrinsic Amplitude: 5 mV
Lead Channel Setting Pacing Amplitude: 0.75 V
Lead Channel Setting Pacing Amplitude: 1.5 V
Lead Channel Setting Pacing Pulse Width: 0.5 ms
Lead Channel Setting Sensing Sensitivity: 4 mV
Pulse Gen Model: 2272
Pulse Gen Serial Number: 8900450

## 2023-05-09 NOTE — Progress Notes (Signed)
Remote pacemaker transmission.   

## 2023-07-10 ENCOUNTER — Ambulatory Visit (INDEPENDENT_AMBULATORY_CARE_PROVIDER_SITE_OTHER): Payer: Managed Care, Other (non HMO)

## 2023-07-10 DIAGNOSIS — I442 Atrioventricular block, complete: Secondary | ICD-10-CM

## 2023-07-21 NOTE — Progress Notes (Signed)
Remote pacemaker transmission.   

## 2023-08-22 ENCOUNTER — Other Ambulatory Visit: Payer: Self-pay | Admitting: Urology

## 2023-08-22 DIAGNOSIS — R972 Elevated prostate specific antigen [PSA]: Secondary | ICD-10-CM

## 2023-08-22 DIAGNOSIS — N403 Nodular prostate with lower urinary tract symptoms: Secondary | ICD-10-CM

## 2023-08-31 ENCOUNTER — Other Ambulatory Visit (HOSPITAL_COMMUNITY): Payer: Self-pay | Admitting: Urology

## 2023-08-31 DIAGNOSIS — N403 Nodular prostate with lower urinary tract symptoms: Secondary | ICD-10-CM

## 2023-08-31 DIAGNOSIS — R972 Elevated prostate specific antigen [PSA]: Secondary | ICD-10-CM

## 2023-10-09 ENCOUNTER — Ambulatory Visit (INDEPENDENT_AMBULATORY_CARE_PROVIDER_SITE_OTHER): Payer: Managed Care, Other (non HMO)

## 2023-10-09 DIAGNOSIS — I442 Atrioventricular block, complete: Secondary | ICD-10-CM

## 2023-10-13 LAB — CUP PACEART REMOTE DEVICE CHECK
Battery Remaining Longevity: 49 mo
Battery Remaining Percentage: 39 %
Battery Voltage: 2.98 V
Brady Statistic AP VP Percent: 17 %
Brady Statistic AP VS Percent: 1 %
Brady Statistic AS VP Percent: 78 %
Brady Statistic AS VS Percent: 3.5 %
Brady Statistic RA Percent Paced: 17 %
Brady Statistic RV Percent Paced: 95 %
Date Time Interrogation Session: 20241108032838
Implantable Lead Connection Status: 753985
Implantable Lead Connection Status: 753985
Implantable Lead Implant Date: 20180504
Implantable Lead Implant Date: 20180504
Implantable Lead Location: 753859
Implantable Lead Location: 753860
Implantable Pulse Generator Implant Date: 20180504
Lead Channel Impedance Value: 510 Ohm
Lead Channel Impedance Value: 610 Ohm
Lead Channel Pacing Threshold Amplitude: 0.5 V
Lead Channel Pacing Threshold Amplitude: 0.5 V
Lead Channel Pacing Threshold Pulse Width: 0.5 ms
Lead Channel Pacing Threshold Pulse Width: 0.5 ms
Lead Channel Sensing Intrinsic Amplitude: 12 mV
Lead Channel Sensing Intrinsic Amplitude: 3.7 mV
Lead Channel Setting Pacing Amplitude: 0.75 V
Lead Channel Setting Pacing Amplitude: 1.5 V
Lead Channel Setting Pacing Pulse Width: 0.5 ms
Lead Channel Setting Sensing Sensitivity: 4 mV
Pulse Gen Model: 2272
Pulse Gen Serial Number: 8900450

## 2023-10-30 NOTE — Progress Notes (Signed)
Remote pacemaker transmission.   

## 2023-11-16 ENCOUNTER — Ambulatory Visit (HOSPITAL_COMMUNITY)
Admission: RE | Admit: 2023-11-16 | Discharge: 2023-11-16 | Disposition: A | Discharge status: Home / Self Care | Payer: Managed Care, Other (non HMO) | Source: Ambulatory Visit | Attending: Urology | Admitting: Urology

## 2023-11-16 DIAGNOSIS — R972 Elevated prostate specific antigen [PSA]: Secondary | ICD-10-CM | POA: Insufficient documentation

## 2023-11-16 DIAGNOSIS — N403 Nodular prostate with lower urinary tract symptoms: Secondary | ICD-10-CM | POA: Diagnosis present

## 2023-11-16 MED ORDER — GADOBUTROL 1 MMOL/ML IV SOLN
8.0000 mL | Freq: Once | INTRAVENOUS | Status: AC | PRN
  Administered 2023-11-16: 8 mL via INTRAVENOUS

## 2024-01-08 ENCOUNTER — Ambulatory Visit (INDEPENDENT_AMBULATORY_CARE_PROVIDER_SITE_OTHER): Payer: Managed Care, Other (non HMO)

## 2024-01-08 DIAGNOSIS — I442 Atrioventricular block, complete: Secondary | ICD-10-CM

## 2024-01-08 LAB — CUP PACEART REMOTE DEVICE CHECK
Battery Remaining Longevity: 46 mo
Battery Remaining Percentage: 37 %
Battery Voltage: 2.96 V
Brady Statistic AP VP Percent: 16 %
Brady Statistic AP VS Percent: 1 %
Brady Statistic AS VP Percent: 80 %
Brady Statistic AS VS Percent: 2.9 %
Brady Statistic RA Percent Paced: 16 %
Brady Statistic RV Percent Paced: 96 %
Date Time Interrogation Session: 20250203040544
Implantable Lead Connection Status: 753985
Implantable Lead Connection Status: 753985
Implantable Lead Implant Date: 20180504
Implantable Lead Implant Date: 20180504
Implantable Lead Location: 753859
Implantable Lead Location: 753860
Implantable Pulse Generator Implant Date: 20180504
Lead Channel Impedance Value: 480 Ohm
Lead Channel Impedance Value: 600 Ohm
Lead Channel Pacing Threshold Amplitude: 0.5 V
Lead Channel Pacing Threshold Amplitude: 0.5 V
Lead Channel Pacing Threshold Pulse Width: 0.5 ms
Lead Channel Pacing Threshold Pulse Width: 0.5 ms
Lead Channel Sensing Intrinsic Amplitude: 12 mV
Lead Channel Sensing Intrinsic Amplitude: 4.8 mV
Lead Channel Setting Pacing Amplitude: 0.75 V
Lead Channel Setting Pacing Amplitude: 1.5 V
Lead Channel Setting Pacing Pulse Width: 0.5 ms
Lead Channel Setting Sensing Sensitivity: 4 mV
Pulse Gen Model: 2272
Pulse Gen Serial Number: 8900450

## 2024-02-14 NOTE — Progress Notes (Signed)
 Remote pacemaker transmission.

## 2024-04-08 ENCOUNTER — Ambulatory Visit (INDEPENDENT_AMBULATORY_CARE_PROVIDER_SITE_OTHER): Payer: Managed Care, Other (non HMO)

## 2024-04-08 DIAGNOSIS — I442 Atrioventricular block, complete: Secondary | ICD-10-CM | POA: Diagnosis not present

## 2024-04-09 LAB — CUP PACEART REMOTE DEVICE CHECK
Battery Remaining Longevity: 43 mo
Battery Remaining Percentage: 34 %
Battery Voltage: 2.96 V
Brady Statistic AP VP Percent: 16 %
Brady Statistic AP VS Percent: 1 %
Brady Statistic AS VP Percent: 80 %
Brady Statistic AS VS Percent: 2.5 %
Brady Statistic RA Percent Paced: 16 %
Brady Statistic RV Percent Paced: 96 %
Date Time Interrogation Session: 20250506014436
Implantable Lead Connection Status: 753985
Implantable Lead Connection Status: 753985
Implantable Lead Implant Date: 20180504
Implantable Lead Implant Date: 20180504
Implantable Lead Location: 753859
Implantable Lead Location: 753860
Implantable Pulse Generator Implant Date: 20180504
Lead Channel Impedance Value: 480 Ohm
Lead Channel Impedance Value: 580 Ohm
Lead Channel Pacing Threshold Amplitude: 0.5 V
Lead Channel Pacing Threshold Amplitude: 0.5 V
Lead Channel Pacing Threshold Pulse Width: 0.5 ms
Lead Channel Pacing Threshold Pulse Width: 0.5 ms
Lead Channel Sensing Intrinsic Amplitude: 12 mV
Lead Channel Sensing Intrinsic Amplitude: 5 mV
Lead Channel Setting Pacing Amplitude: 0.75 V
Lead Channel Setting Pacing Amplitude: 1.5 V
Lead Channel Setting Pacing Pulse Width: 0.5 ms
Lead Channel Setting Sensing Sensitivity: 4 mV
Pulse Gen Model: 2272
Pulse Gen Serial Number: 8900450

## 2024-05-27 NOTE — Addendum Note (Signed)
 Addended by: TAWNI DRILLING D on: 05/27/2024 10:55 AM   Modules accepted: Orders

## 2024-05-27 NOTE — Progress Notes (Signed)
 Remote pacemaker transmission.

## 2024-07-08 ENCOUNTER — Ambulatory Visit: Payer: Managed Care, Other (non HMO)

## 2024-07-08 DIAGNOSIS — I442 Atrioventricular block, complete: Secondary | ICD-10-CM

## 2024-07-08 LAB — CUP PACEART REMOTE DEVICE CHECK
Battery Remaining Longevity: 41 mo
Battery Remaining Percentage: 32 %
Battery Voltage: 2.95 V
Brady Statistic AP VP Percent: 18 %
Brady Statistic AP VS Percent: 1 %
Brady Statistic AS VP Percent: 79 %
Brady Statistic AS VS Percent: 2.3 %
Brady Statistic RA Percent Paced: 17 %
Brady Statistic RV Percent Paced: 97 %
Date Time Interrogation Session: 20250804042257
Implantable Lead Connection Status: 753985
Implantable Lead Connection Status: 753985
Implantable Lead Implant Date: 20180504
Implantable Lead Implant Date: 20180504
Implantable Lead Location: 753859
Implantable Lead Location: 753860
Implantable Pulse Generator Implant Date: 20180504
Lead Channel Impedance Value: 480 Ohm
Lead Channel Impedance Value: 590 Ohm
Lead Channel Pacing Threshold Amplitude: 0.5 V
Lead Channel Pacing Threshold Amplitude: 0.5 V
Lead Channel Pacing Threshold Pulse Width: 0.5 ms
Lead Channel Pacing Threshold Pulse Width: 0.5 ms
Lead Channel Sensing Intrinsic Amplitude: 12 mV
Lead Channel Sensing Intrinsic Amplitude: 5 mV
Lead Channel Setting Pacing Amplitude: 0.75 V
Lead Channel Setting Pacing Amplitude: 1.5 V
Lead Channel Setting Pacing Pulse Width: 0.5 ms
Lead Channel Setting Sensing Sensitivity: 4 mV
Pulse Gen Model: 2272
Pulse Gen Serial Number: 8900450

## 2024-07-09 ENCOUNTER — Ambulatory Visit: Payer: Self-pay | Admitting: Internal Medicine

## 2024-09-02 NOTE — Progress Notes (Signed)
 Remote PPM Transmission

## 2024-10-07 ENCOUNTER — Ambulatory Visit (INDEPENDENT_AMBULATORY_CARE_PROVIDER_SITE_OTHER): Payer: Managed Care, Other (non HMO)

## 2024-10-07 DIAGNOSIS — I442 Atrioventricular block, complete: Secondary | ICD-10-CM | POA: Diagnosis not present

## 2024-10-08 LAB — CUP PACEART REMOTE DEVICE CHECK
Battery Remaining Longevity: 37 mo
Battery Remaining Percentage: 29 %
Battery Voltage: 2.95 V
Brady Statistic AP VP Percent: 18 %
Brady Statistic AP VS Percent: 1 %
Brady Statistic AS VP Percent: 78 %
Brady Statistic AS VS Percent: 2.1 %
Brady Statistic RA Percent Paced: 18 %
Brady Statistic RV Percent Paced: 97 %
Date Time Interrogation Session: 20251104040208
Implantable Lead Connection Status: 753985
Implantable Lead Connection Status: 753985
Implantable Lead Implant Date: 20180504
Implantable Lead Implant Date: 20180504
Implantable Lead Location: 753859
Implantable Lead Location: 753860
Implantable Pulse Generator Implant Date: 20180504
Lead Channel Impedance Value: 480 Ohm
Lead Channel Impedance Value: 600 Ohm
Lead Channel Pacing Threshold Amplitude: 0.5 V
Lead Channel Pacing Threshold Amplitude: 0.5 V
Lead Channel Pacing Threshold Pulse Width: 0.5 ms
Lead Channel Pacing Threshold Pulse Width: 0.5 ms
Lead Channel Sensing Intrinsic Amplitude: 12 mV
Lead Channel Sensing Intrinsic Amplitude: 5 mV
Lead Channel Setting Pacing Amplitude: 0.75 V
Lead Channel Setting Pacing Amplitude: 1.5 V
Lead Channel Setting Pacing Pulse Width: 0.5 ms
Lead Channel Setting Sensing Sensitivity: 4 mV
Pulse Gen Model: 2272
Pulse Gen Serial Number: 8900450

## 2024-10-10 ENCOUNTER — Ambulatory Visit: Payer: Self-pay | Admitting: Internal Medicine

## 2024-10-10 NOTE — Progress Notes (Signed)
 Remote PPM Transmission

## 2025-01-06 ENCOUNTER — Ambulatory Visit: Payer: Managed Care, Other (non HMO)

## 2025-01-07 LAB — CUP PACEART REMOTE DEVICE CHECK
Battery Remaining Longevity: 34 mo
Battery Remaining Percentage: 27 %
Battery Voltage: 2.93 V
Brady Statistic AP VP Percent: 17 %
Brady Statistic AP VS Percent: 1 %
Brady Statistic AS VP Percent: 79 %
Brady Statistic AS VS Percent: 2 %
Brady Statistic RA Percent Paced: 17 %
Brady Statistic RV Percent Paced: 97 %
Date Time Interrogation Session: 20260202044027
Implantable Lead Connection Status: 753985
Implantable Lead Connection Status: 753985
Implantable Lead Implant Date: 20180504
Implantable Lead Implant Date: 20180504
Implantable Lead Location: 753859
Implantable Lead Location: 753860
Implantable Pulse Generator Implant Date: 20180504
Lead Channel Impedance Value: 440 Ohm
Lead Channel Impedance Value: 540 Ohm
Lead Channel Pacing Threshold Amplitude: 0.5 V
Lead Channel Pacing Threshold Amplitude: 0.5 V
Lead Channel Pacing Threshold Pulse Width: 0.5 ms
Lead Channel Pacing Threshold Pulse Width: 0.5 ms
Lead Channel Sensing Intrinsic Amplitude: 12 mV
Lead Channel Sensing Intrinsic Amplitude: 5 mV
Lead Channel Setting Pacing Amplitude: 0.75 V
Lead Channel Setting Pacing Amplitude: 1.5 V
Lead Channel Setting Pacing Pulse Width: 0.5 ms
Lead Channel Setting Sensing Sensitivity: 4 mV
Pulse Gen Model: 2272
Pulse Gen Serial Number: 8900450

## 2025-04-07 ENCOUNTER — Ambulatory Visit: Payer: Managed Care, Other (non HMO)

## 2025-07-07 ENCOUNTER — Ambulatory Visit: Payer: Managed Care, Other (non HMO)
